# Patient Record
Sex: Female | Born: 1950 | Hispanic: No | Marital: Single | Smoking: Never smoker
Health system: Southern US, Community
[De-identification: ages and names within clinical notes are randomized; demographics above are authoritative.]

## PROBLEM LIST (undated history)

## (undated) DIAGNOSIS — M1990 Unspecified osteoarthritis, unspecified site: Secondary | ICD-10-CM

## (undated) DIAGNOSIS — E119 Type 2 diabetes mellitus without complications: Secondary | ICD-10-CM

## (undated) DIAGNOSIS — Z87442 Personal history of urinary calculi: Secondary | ICD-10-CM

## (undated) DIAGNOSIS — Z8709 Personal history of other diseases of the respiratory system: Secondary | ICD-10-CM

## (undated) DIAGNOSIS — M6530 Trigger finger, unspecified finger: Secondary | ICD-10-CM

## (undated) DIAGNOSIS — E669 Obesity, unspecified: Secondary | ICD-10-CM

## (undated) DIAGNOSIS — E785 Hyperlipidemia, unspecified: Secondary | ICD-10-CM

## (undated) DIAGNOSIS — K259 Gastric ulcer, unspecified as acute or chronic, without hemorrhage or perforation: Secondary | ICD-10-CM

## (undated) DIAGNOSIS — Z972 Presence of dental prosthetic device (complete) (partial): Secondary | ICD-10-CM

## (undated) DIAGNOSIS — I10 Essential (primary) hypertension: Secondary | ICD-10-CM

## (undated) DIAGNOSIS — K219 Gastro-esophageal reflux disease without esophagitis: Secondary | ICD-10-CM

## (undated) DIAGNOSIS — I739 Peripheral vascular disease, unspecified: Secondary | ICD-10-CM

## (undated) HISTORY — DX: Hyperlipidemia, unspecified: E78.5

## (undated) HISTORY — PX: CHOLECYSTECTOMY: SHX55

## (undated) HISTORY — DX: Unspecified osteoarthritis, unspecified site: M19.90

## (undated) HISTORY — PX: LITHOTRIPSY: SUR834

## (undated) HISTORY — PX: CARPAL TUNNEL RELEASE: SHX101

## (undated) HISTORY — DX: Essential (primary) hypertension: I10

## (undated) HISTORY — PX: TRIGGER FINGER RELEASE: SHX641

## (undated) HISTORY — DX: Type 2 diabetes mellitus without complications: E11.9

## (undated) HISTORY — PX: TUBAL LIGATION: SHX77

## (undated) HISTORY — PX: ORIF FINGER FRACTURE: SHX2122

## (undated) HISTORY — PX: KIDNEY STONE SURGERY: SHX686

## (undated) HISTORY — DX: Peripheral vascular disease, unspecified: I73.9

## (undated) HISTORY — PX: TOTAL KNEE ARTHROPLASTY: SHX125

---

## 2003-08-27 ENCOUNTER — Emergency Department (HOSPITAL_COMMUNITY): Admission: EM | Admit: 2003-08-27 | Discharge: 2003-08-27 | Payer: Self-pay | Admitting: Emergency Medicine

## 2011-10-15 ENCOUNTER — Observation Stay (HOSPITAL_COMMUNITY)
Admission: EM | Admit: 2011-10-15 | Discharge: 2011-10-15 | Disposition: A | Discharge status: Home / Self Care | Payer: Medicare Other | Attending: Emergency Medicine | Admitting: Emergency Medicine

## 2011-10-15 ENCOUNTER — Emergency Department (HOSPITAL_COMMUNITY): Payer: Medicare Other

## 2011-10-15 ENCOUNTER — Encounter (HOSPITAL_COMMUNITY): Payer: Self-pay | Admitting: *Deleted

## 2011-10-15 DIAGNOSIS — R062 Wheezing: Principal | ICD-10-CM | POA: Insufficient documentation

## 2011-10-15 HISTORY — DX: Obesity, unspecified: E66.9

## 2011-10-15 LAB — DIFFERENTIAL
Basophils Absolute: 0 10*3/uL (ref 0.0–0.1)
Basophils Relative: 0 % (ref 0–1)
Eosinophils Absolute: 0.1 10*3/uL (ref 0.0–0.7)
Eosinophils Relative: 1 % (ref 0–5)
Lymphs Abs: 1 10*3/uL (ref 0.7–4.0)
Neutrophils Relative %: 87 % — ABNORMAL HIGH (ref 43–77)

## 2011-10-15 LAB — BASIC METABOLIC PANEL
Calcium: 9.4 mg/dL (ref 8.4–10.5)
GFR calc Af Amer: >90 mL/min (ref >90)
GFR calc non Af Amer: >90 mL/min (ref >90)
Glucose, Bld: 147 mg/dL — ABNORMAL HIGH (ref 70–99)
Potassium: 3.4 mEq/L — ABNORMAL LOW (ref 3.5–5.1)
Sodium: 137 mEq/L (ref 135–145)

## 2011-10-15 LAB — CBC
MCH: 27.7 pg (ref 26.0–34.0)
MCV: 86.2 fL (ref 78.0–100.0)
Platelets: 209 10*3/uL (ref 150–400)
RBC: 4.65 MIL/uL (ref 3.87–5.11)
RDW: 13.7 % (ref 11.5–15.5)

## 2011-10-15 MED ORDER — ALBUTEROL SULFATE (5 MG/ML) 0.5% IN NEBU
INHALATION_SOLUTION | RESPIRATORY_TRACT | Status: AC
  Filled 2011-10-15: qty 1

## 2011-10-15 MED ORDER — ALBUTEROL SULFATE HFA 108 (90 BASE) MCG/ACT IN AERS: 1.0000 | INHALATION_SPRAY | Freq: Four times a day (QID) | RESPIRATORY_TRACT | Status: DC | PRN

## 2011-10-15 MED ORDER — PREDNISONE (PAK) 10 MG PO TABS: ORAL_TABLET | ORAL | Status: DC

## 2011-10-15 MED ORDER — ALBUTEROL SULFATE HFA 108 (90 BASE) MCG/ACT IN AERS
2.0000 | INHALATION_SPRAY | RESPIRATORY_TRACT | Status: DC | PRN
  Administered 2011-10-15: 2 via RESPIRATORY_TRACT
  Filled 2011-10-15: qty 6.7

## 2011-10-15 MED ORDER — IPRATROPIUM BROMIDE 0.02 % IN SOLN
RESPIRATORY_TRACT | Status: AC
  Filled 2011-10-15: qty 2.5

## 2011-10-15 MED ORDER — ALBUTEROL SULFATE (5 MG/ML) 0.5% IN NEBU
2.5000 mg | INHALATION_SOLUTION | RESPIRATORY_TRACT | Status: DC | PRN
  Administered 2011-10-15: 2.5 mg via RESPIRATORY_TRACT
  Filled 2011-10-15: qty 0.5

## 2011-10-15 MED ORDER — ALBUTEROL SULFATE (5 MG/ML) 0.5% IN NEBU
2.5000 mg | INHALATION_SOLUTION | Freq: Once | RESPIRATORY_TRACT | Status: AC
  Administered 2011-10-15: 2.5 mg via RESPIRATORY_TRACT

## 2011-10-15 MED ORDER — METHYLPREDNISOLONE SODIUM SUCC 125 MG IJ SOLR
125.0000 mg | Freq: Once | INTRAMUSCULAR | Status: AC
  Administered 2011-10-15: 125 mg via INTRAVENOUS
  Filled 2011-10-15: qty 2

## 2011-10-15 MED ORDER — PREDNISONE 20 MG PO TABS
60.0000 mg | ORAL_TABLET | Freq: Once | ORAL | Status: AC
  Administered 2011-10-15: 60 mg via ORAL
  Filled 2011-10-15: qty 3

## 2011-10-15 MED ORDER — ALBUTEROL SULFATE (5 MG/ML) 0.5% IN NEBU
5.0000 mg | INHALATION_SOLUTION | Freq: Once | RESPIRATORY_TRACT | Status: AC
  Administered 2011-10-15: 5 mg via RESPIRATORY_TRACT

## 2011-10-15 MED ORDER — ALBUTEROL SULFATE (5 MG/ML) 0.5% IN NEBU
INHALATION_SOLUTION | RESPIRATORY_TRACT | Status: AC
  Filled 2011-10-15: qty 0.5

## 2011-10-15 MED ORDER — ALBUTEROL SULFATE (5 MG/ML) 0.5% IN NEBU
5.0000 mg | INHALATION_SOLUTION | Freq: Once | RESPIRATORY_TRACT | Status: AC
  Administered 2011-10-15: 5 mg via RESPIRATORY_TRACT
  Filled 2011-10-15: qty 1

## 2011-10-15 MED ORDER — IPRATROPIUM BROMIDE 0.02 % IN SOLN
0.5000 mg | Freq: Once | RESPIRATORY_TRACT | Status: AC
  Administered 2011-10-15: 0.5 mg via RESPIRATORY_TRACT
  Filled 2011-10-15 (×2): qty 2.5

## 2011-10-15 MED ORDER — ACETAMINOPHEN 325 MG PO TABS
ORAL_TABLET | ORAL | Status: AC
  Administered 2011-10-15: 650 mg
  Filled 2011-10-15: qty 2

## 2011-10-15 MED ORDER — IPRATROPIUM BROMIDE 0.02 % IN SOLN
0.5000 mg | Freq: Once | RESPIRATORY_TRACT | Status: AC
  Administered 2011-10-15: 0.5 mg via RESPIRATORY_TRACT

## 2011-10-15 NOTE — ED Provider Notes (Signed)
Patient placed in CDU for continuation of treatment under adult wheezing protocol.  On initial assessment, patient continues to have expiratory wheezing bilaterally.  Oxygen saturations remaining in mid to upper 90's.  Will repeat albuterol breathing treatment and reassess.  9:42 PM Patient resting comfortably with family at bedside.  Wheezing has subsided, lungs currently CTA bilaterally.  Mildly tachycardic heart rate (likely due to beta agonist).  Patient states she feels better and would like to try to return home. Albuterol HFA provided.  RX for continuation of prednisone and HFA refill provided.  Patient is working on assignment with [facility].  Jimmye Norman, NP 10/15/11 2154

## 2011-10-15 NOTE — ED Notes (Signed)
RESP THERAPY COMING TO DO PEAK FLOW AND TX ON PT

## 2011-10-15 NOTE — Discharge Instructions (Signed)
Using Your Inhaler 1. Take the cap off the mouthpiece.  2. Shake the inhaler for 5 seconds.  3. Turn the inhaler so the bottle is above the mouthpiece. Hold it away from your mouth, at a distance of the width of 2 fingers.  4. Open your mouth widely, and tilt your head back slightly. Let your breath out.  5. Take a deep breath in slowly through your mouth. At the same time, push down on the bottle 1 time. You will feel the medicine enter your mouth and throat as you breathe.  6. Continue to take a deep breath in very slowly.  7. After you have breathed in completely, hold your breath for 10 seconds. This will help the medicine to settle in your lungs. If you cannot hold your breath for 10 seconds, hold it for as long as you can before you breathe out.  8. If your doctor has told you to take more than 1 puff, wait at least 1 minute between puffs. This will help you get the best results from your medicine.  9. If you use a steroid inhaler, rinse out your mouth after each dose.  10. Wash your inhaler once a day. Remove the bottle from the mouthpiece. Rinse the mouthpiece and cap with warm water. Dry everything well before you put the inhaler back together.  Document Released: 02/11/2008 Document Revised: 04/23/2011 Document Reviewed: 02/19/2009 [facility] Patient Information 2012 [place], [place].  Bronchospasm, Adult Bronchospasm means that there is a spasm or tightening of the airways going into the lungs. Because the airways go into a spasm and get smaller it makes breathing more difficult. For reasons not completely known, workings (functions) of the airways designed to protect the lungs become over active. This causes the airways to become more sensitive to:  Infection.   Weather.   Exercise.   Irritants.   Things that cause allergic reactions or allergies (allergens).  Frequent coughing or respiratory episodes should be checked for the cause. This condition may be made worse by  exercise. CAUSES  Inflammation is often the cause of this condition. Allergy, viral respiratory infections, or irritants in the air often cause this problem. Allergic reactions produce immediate and delayed responses. Late reactions may produce more serious inflammation. This may lead to increased reactivity of the airways. Sometimes this is inherited. Some common triggers are:  Allergies.   Infection commonly triggers attacks. Antibiotics are not helpful for viral infections and usually do not help with attacks of bronchospasm.   Exercise (running, etc.) can trigger an attack. Proper pre-exercise medications help most individuals participate in sports. Swimming is the least likely sport to cause problems.   Irritants (for example, pollution, cigarette smoke, strong odors, aerosol sprays, paint fumes, etc.) may trigger attacks. You cannot smoke and do not allow smoking in your home. This is absolutely necessary. Show this instruction to mates, relatives and significant others that may not agree with you.   Weather changes may cause lung problems but moving around trying to find an ideal climate does not seem to be overly helpful. Winds increase molds and pollens in the air. Rain refreshes the air by washing irritants out. Cold air may cause irritation.   Emotional problems do not cause lung problems but can trigger attacks.  SYMPTOMS  Wheezing is the most common symptom. Frequent coughing (with or without exercise and or crying) and repeated respiratory infections are all early warning signs of bronchospasm. Chest tightness and shortness of breath are other symptoms. DIAGNOSIS  Early hidden bronchospasm may go for long periods of time without being detected. This is especially true if wheezing cannot be detected by your caregiver. Lung (pulmonary) function studies may help with diagnosis in these cases. HOME CARE INSTRUCTIONS   It is necessary to remain calm during an attack. Try to relax and  breathe more slowly. During this time medications may be given. If any breathing problems seem to be getting worse and are unresponsive to treatment seek immediate medical care.   If you have severe breathing difficulty or have had a life threatening attack it is probably a good idea for you to learn how to give adrenaline (epi-pen) or use an anaphylaxis kit. Your caregiver can help you with this. These are the same kits carried by people who have severe allergic reactions. This is especially important if you do not have readily accessible medical care.   With any severe breathing problems where epinephrine (adrenaline) has been given at home call 911 immediately as the delayed reaction may be even more severe.  SEEK MEDICAL CARE IF:   There is wheezing and shortness of breath, even if medications are given to prevent attacks.   An oral temperature above 102 F (38.9 C) develops.   There are muscle aches, chest pain, or thickening of sputum.   The sputum changes from clear or white to yellow, green, gray, or bloody.   There are problems that may be related to the medicine you are given, such as a rash, itching, swelling, or trouble breathing.  SEEK IMMEDIATE MEDICAL CARE IF:   The usual medicines do not stop your wheezing, or there is increased coughing.   You have increased difficulty breathing.  MAKE SURE YOU:   Understand these instructions.   Will watch your condition.   Will get help right away if you are not doing well or get worse.  Document Released: 05/07/2003 Document Revised: 04/23/2011 Document Reviewed: 12/21/2007 [facility] Patient Information 2012 [place], [place].

## 2011-10-15 NOTE — ED Notes (Signed)
Reports having cough that started yesterday, now having wheezing, reports hx of asthma, spo2 95% at triage.

## 2011-10-15 NOTE — ED Notes (Signed)
MD at bedside. 

## 2011-10-18 NOTE — ED Provider Notes (Signed)
Medical screening examination/treatment/procedure(s) were performed by non-physician practitioner and as supervising physician I was immediately available for consultation/collaboration.   Gavin Pound. Oletta Lamas, MD 10/18/11 [phone]

## 2011-12-30 NOTE — ED Provider Notes (Signed)
History     CSN: [id number]  Arrival date & time 10/15/11  1206   First MD Initiated Contact with Patient 10/15/11 1330      Chief Complaint  Patient presents with  . Wheezing  . Cough    (Consider location/radiation/quality/duration/timing/severity/associated sxs/prior treatment) Patient is a 61 y.o. female presenting with wheezing and cough. The history is provided by the patient.  Wheezing  Associated symptoms include cough and wheezing.  Cough Associated symptoms include wheezing.  She had onset yesterday of nonproductive cough and wheezing and dyspnea. She denies fever, chills, sweats. She denies chest pain. There's been no nausea or vomiting. Using her albuterol inhaler with no relief. Symptoms are described as severe. Nothing makes her symptoms better but nothing makes it worse either.  Past Medical History  Diagnosis Date  . Asthma   . Acid reflux   . Obesity     History reviewed. No pertinent past surgical history.  History reviewed. No pertinent family history.  History  Substance Use Topics  . Smoking status: Not on file  . Smokeless tobacco: Not on file  . Alcohol Use: No    OB History    Grav Para Term Preterm Abortions TAB SAB Ect Mult Living                  Review of Systems  Respiratory: Positive for cough and wheezing.   All other systems reviewed and are negative.    Allergies  Codeine and Penicillins  Home Medications   Current Outpatient Rx  Name Route Sig Dispense Refill  . VITAMIN D-3 PO Oral Take 1 tablet by mouth daily.    . ALBUTEROL SULFATE HFA 108 (90 BASE) MCG/ACT IN AERS Inhalation Inhale 1-2 puffs into the lungs every 6 (six) hours as needed for wheezing. 1 Inhaler 0  . PREDNISONE (PAK) 10 MG PO TABS  Take 5 tabs day 1 (tomorrow), 4 tabs day 2, 3 tabs day 3, 2 tabs day 4, 1 tab day 5 15 tablet 0    BP 140/75  Pulse 118  Temp 98.5 F (36.9 C) (Oral)  Resp 20  SpO2 99%  Physical Exam  Nursing note and vitals  reviewed. 61year old female, in mild respiratory distress. Vital signs are significant for tachycardia with heart rate 118. Oxygen saturation is 99%, which is normal. Head is normocephalic and atraumatic. PERRLA, EOMI. Oropharynx is clear. Neck is nontender and supple without adenopathy or JVD. Back is nontender and there is no CVA tenderness. Lungs have diffuse inspiratory and expiratory wheezes, no rales or rhonchi. Chest is nontender. Heart has regular rate and rhythm without murmur. Abdomen is soft, flat, nontender without masses or hepatosplenomegaly and peristalsis is normoactive. Extremities have no cyanosis or edema, full range of motion is present. Skin is warm and dry without rash. Neurologic: Mental status is normal, cranial nerves are intact, there are no motor or sensory deficits.   ED Course  Procedures (including critical care time)  Results for orders placed during the hospital encounter of 10/15/11  CBC      Component Value Range   WBC 14.4 (*) 4.0 - 10.5 K/uL   RBC 4.65  3.87 - 5.11 MIL/uL   Hemoglobin 12.9  12.0 - 15.0 g/dL   HCT 40.9  81.1 - 91.4 %   MCV 86.2  78.0 - 100.0 fL   MCH 27.7  26.0 - 34.0 pg   MCHC 32.2  30.0 - 36.0 g/dL   RDW 78.2  95.6 -  15.5 %   Platelets 209  150 - 400 K/uL  DIFFERENTIAL      Component Value Range   Neutrophils Relative 87 (*) 43 - 77 %   Neutro Abs 12.6 (*) 1.7 - 7.7 K/uL   Lymphocytes Relative 7 (*) 12 - 46 %   Lymphs Abs 1.0  0.7 - 4.0 K/uL   Monocytes Relative 5  3 - 12 %   Monocytes Absolute 0.8  0.1 - 1.0 K/uL   Eosinophils Relative 1  0 - 5 %   Eosinophils Absolute 0.1  0.0 - 0.7 K/uL   Basophils Relative 0  0 - 1 %   Basophils Absolute 0.0  0.0 - 0.1 K/uL  BASIC METABOLIC PANEL      Component Value Range   Sodium 137  135 - 145 mEq/L   Potassium 3.4 (*) 3.5 - 5.1 mEq/L   Chloride 98  96 - 112 mEq/L   CO2 24  19 - 32 mEq/L   Glucose, Bld 147 (*) 70 - 99 mg/dL   BUN 12  6 - 23 mg/dL   Creatinine, Ser 1.30  0.50  - 1.10 mg/dL   Calcium 9.4  8.4 - 86.5 mg/dL   GFR calc non Af Amer >90  >90 mL/min   GFR calc Af Amer >90  >90 mL/min    1. Wheezing without diagnosis of asthma       MDM  Acute bronchospasm. She will be given steroids and albuterol with Atrovent..  Inadequate relief with initial nebulizer treatments. She will be placed in CDU under the asthma protocol.        Dione Booze, MD 12/30/11 [phone]

## 2012-03-21 ENCOUNTER — Encounter: Payer: Self-pay | Admitting: Gastroenterology

## 2012-04-13 ENCOUNTER — Ambulatory Visit (INDEPENDENT_AMBULATORY_CARE_PROVIDER_SITE_OTHER): Payer: Medicare Other | Admitting: Gastroenterology

## 2012-04-13 ENCOUNTER — Encounter: Payer: Self-pay | Admitting: Gastroenterology

## 2012-04-13 VITALS — BP 124/82 | HR 72 | Ht <= 58 in | Wt 209.0 lb

## 2012-04-13 DIAGNOSIS — R1310 Dysphagia, unspecified: Secondary | ICD-10-CM | POA: Insufficient documentation

## 2012-04-13 DIAGNOSIS — K222 Esophageal obstruction: Secondary | ICD-10-CM

## 2012-04-13 DIAGNOSIS — Z1211 Encounter for screening for malignant neoplasm of colon: Secondary | ICD-10-CM

## 2012-04-13 DIAGNOSIS — K219 Gastro-esophageal reflux disease without esophagitis: Secondary | ICD-10-CM

## 2012-04-13 MED ORDER — OMEPRAZOLE-SODIUM BICARBONATE 40-1100 MG PO CAPS: ORAL_CAPSULE | ORAL | Status: DC

## 2012-04-13 MED ORDER — NA SULFATE-K SULFATE-MG SULF 17.5-3.13-1.6 GM/177ML PO SOLN: 1.0000 | Freq: Once | ORAL | Status: DC

## 2012-04-13 NOTE — Assessment & Plan Note (Signed)
The patient is symptomatic with pyrosis. Cough may also be related.  Recommendations #1 trial of Zegerid before dinner

## 2012-04-13 NOTE — Progress Notes (Signed)
History of Present Illness: Pleasant 61 year old Hispanic female referred at the request of Dr. Daphine Deutscher for evaluation of dysphagia. She has developed dysphagia to solids. She's had what sounds like a minor food impactions. She has frequent pyrosis for which she has taken PPIs in the past. She's also complaining of nocturnal coughing.    Past Medical History  Diagnosis Date  . Asthma   . Acid reflux   . Obesity   . Arthritis    Past Surgical History  Procedure Date  . Cholecystectomy   . Tubal ligation   . Finger surgery   . Carpal tunnel release   . Trigger finger release   . Total knee arthroplasty     Bilateral   . Leg surgery    family history is not on file.  She is adopted. Current Outpatient Prescriptions  Medication Sig Dispense Refill  . albuterol (PROVENTIL HFA;VENTOLIN HFA) 108 (90 BASE) MCG/ACT inhaler Inhale 1-2 puffs into the lungs every 6 (six) hours as needed for wheezing.  1 Inhaler  0  . Cholecalciferol (VITAMIN D-3 PO) Take 1 tablet by mouth daily.      . predniSONE (STERAPRED UNI-PAK) 10 MG tablet Take 5 tabs day 1 (tomorrow), 4 tabs day 2, 3 tabs day 3, 2 tabs day 4, 1 tab day 5  15 tablet  0   Allergies as of 04/13/2012 - Review Complete 04/13/2012  Allergen Reaction Noted  . Codeine Nausea And Vomiting 10/15/2011  . Penicillins Other (See Comments) 10/15/2011    reports that she has never smoked. She has never used smokeless tobacco. She reports that she does not drink alcohol or use illicit drugs.     Review of Systems: She's had a vaginal sling in the past and recently has been complaining of dropping of her organs. Pertinent positive and negative review of systems were noted in the above HPI section. All other review of systems were otherwise negative.  Vital signs were reviewed in today's medical record Physical Exam: General: Well developed , well nourished, no acute distress Head: Normocephalic and atraumatic Eyes:  sclerae anicteric,  EOMI Ears: Normal auditory acuity Mouth: No deformity or lesions Neck: Supple, no masses or thyromegaly Lungs: Clear throughout to auscultation Heart: Regular rate and rhythm; no murmurs, rubs or bruits Abdomen: Soft, non tender and non distended. No masses, hepatosplenomegaly or hernias noted. Normal Bowel sounds Rectal:deferred Musculoskeletal: Symmetrical with no gross deformities  Skin: No lesions on visible extremities Pulses:  Normal pulses noted Extremities: No clubbing, cyanosis, edema or deformities noted Neurological: Alert oriented x 4, grossly nonfocal Cervical Nodes:  No significant cervical adenopathy Inguinal Nodes: No significant inguinal adenopathy Psychological:  Alert and cooperative. Normal mood and affect

## 2012-04-13 NOTE — Assessment & Plan Note (Signed)
Suspect peptic stricture  Recommendations #1 upper endoscopy with dilatation as indicated

## 2012-04-13 NOTE — Assessment & Plan Note (Signed)
Plan screening colonoscopy 

## 2012-04-13 NOTE — Patient Instructions (Addendum)
Your Endoscopy has been scheduled on 05/27/2012 at 4pm Your Colonoscopy has been scheduled on 06/09/2012 at 2pm Separate instructions have been given Bring all inhalers with you to your appointment

## 2012-05-27 ENCOUNTER — Encounter: Payer: Self-pay | Admitting: Gastroenterology

## 2012-05-27 ENCOUNTER — Ambulatory Visit (AMBULATORY_SURGERY_CENTER): Payer: Medicare Other | Admitting: Gastroenterology

## 2012-05-27 VITALS — BP 109/72 | HR 100 | Temp 97.3°F | Resp 17 | Ht <= 58 in | Wt 209.0 lb

## 2012-05-27 DIAGNOSIS — R1310 Dysphagia, unspecified: Secondary | ICD-10-CM

## 2012-05-27 DIAGNOSIS — K222 Esophageal obstruction: Secondary | ICD-10-CM

## 2012-05-27 DIAGNOSIS — K219 Gastro-esophageal reflux disease without esophagitis: Secondary | ICD-10-CM

## 2012-05-27 HISTORY — PX: ESOPHAGOGASTRODUODENOSCOPY (EGD) WITH ESOPHAGEAL DILATION: SHX5812

## 2012-05-27 MED ORDER — SODIUM CHLORIDE 0.9 % IV SOLN: 500.0000 mL | INTRAVENOUS | Status: DC

## 2012-05-27 NOTE — Op Note (Signed)
 [facility] [address] [place], [address]   ENDOSCOPY PROCEDURE REPORT  PATIENT: Lindsay Fischer, Lindsay Fischer  MR#: [id number] BIRTHDATE: September 12, 1950 , 61  yrs. old GENDER: Female ENDOSCOPIST: Louis Meckel, MD REFERRED BY:  Alwyn Pea, M.D. PROCEDURE DATE:  05/27/2012 PROCEDURE:  EGD, diagnostic and Maloney dilation of esophagus ASA CLASS:     Class II INDICATIONS:  Dysphagia. MEDICATIONS: MAC sedation, administered by CRNA, Propofol (Diprivan) 80 mg IV, and Robinul 0.2 mg IV TOPICAL ANESTHETIC:  DESCRIPTION OF PROCEDURE: After the risks benefits and alternatives of the procedure were thoroughly explained, informed consent was obtained.  The [facility] [id number] [id number] endoscope was introduced through the mouth and advanced to the third portion of the duodenum. Without limitations.  The instrument was slowly withdrawn as the mucosa was fully examined.      At the GE junction there was an early esophageal stricture.  The 9.8 mm gastroscope passed with mild resistance.  There was no heme.  A small sliding hiatal hernia was present.   At the GE junction there was an early esophageal stricture.  The 9.8 mm gastroscope passed with mild resistance.  There was no heme.  A small sliding hiatal hernia was present.   The remainder of the upper endoscopy exam was otherwise normal.  Retroflexed views revealed no abnormalities. The scope was then withdrawn from the patient and the procedure completed.  COMPLICATIONS: There were no complications. ENDOSCOPIC IMPRESSION: 1.   early esophageal stricture-status post [facility] dilation  RECOMMENDATIONS: repeat dilatation as needed REPEAT EXAM:  eSigned:  Louis Meckel, MD 05/27/2012 4:38 PM   CC:

## 2012-05-27 NOTE — Progress Notes (Signed)
Patient did not experience any of the following events: a burn prior to discharge; a fall within the facility; wrong site/side/patient/procedure/implant event; or a hospital transfer or hospital admission upon discharge from the facility. (G8907) Patient did not have preoperative order for IV antibiotic SSI prophylaxis. (G8918)  

## 2012-05-27 NOTE — Progress Notes (Signed)
Stable to RR 

## 2012-05-27 NOTE — Patient Instructions (Addendum)
Discharge instructions given with verbal understanding. Handout on a dilatation diet given. Resume previous medications. YOU HAD AN ENDOSCOPIC PROCEDURE TODAY AT THE [facility]: Refer to the procedure report that was given to you for any specific questions about what was found during the examination.  If the procedure report does not answer your questions, please call your gastroenterologist to clarify.  If you requested that your care partner not be given the details of your procedure findings, then the procedure report has been included in a sealed envelope for you to review at your convenience later.  YOU SHOULD EXPECT: Some feelings of bloating in the abdomen. Passage of more gas than usual.  Walking can help get rid of the air that was put into your GI tract during the procedure and reduce the bloating. If you had a lower endoscopy (such as a colonoscopy or flexible sigmoidoscopy) you may notice spotting of blood in your stool or on the toilet paper. If you underwent a bowel prep for your procedure, then you may not have a normal bowel movement for a few days.  DIET: Your first meal following the procedure should be a light meal and then it is ok to progress to your normal diet.  A half-sandwich or bowl of soup is an example of a good first meal.  Heavy or fried foods are harder to digest and may make you feel nauseous or bloated.  Likewise meals heavy in dairy and vegetables can cause extra gas to form and this can also increase the bloating.  Drink plenty of fluids but you should avoid alcoholic beverages for 24 hours.  ACTIVITY: Your care partner should take you home directly after the procedure.  You should plan to take it easy, moving slowly for the rest of the day.  You can resume normal activity the day after the procedure however you should NOT DRIVE or use heavy machinery for 24 hours (because of the sedation medicines used during the test).    SYMPTOMS TO REPORT  IMMEDIATELY: A gastroenterologist can be reached at any hour.  During normal business hours, 8:30 AM to 5:00 PM Monday through Friday, call [phone].  After hours and on weekends, please call the GI answering service at [phone] who will take a message and have the physician on call contact you.   Following upper endoscopy (EGD)  Vomiting of blood or coffee ground material  New chest pain or pain under the shoulder blades  Painful or persistently difficult swallowing  New shortness of breath  Fever of 100F or higher  Black, tarry-looking stools  FOLLOW UP: If any biopsies were taken you will be contacted by phone or by letter within the next 1-3 weeks.  Call your gastroenterologist if you have not heard about the biopsies in 3 weeks.  Our staff will call the home number listed on your records the next business day following your procedure to check on you and address any questions or concerns that you may have at that time regarding the information given to you following your procedure. This is a courtesy call and so if there is no answer at the home number and we have not heard from you through the emergency physician on call, we will assume that you have returned to your regular daily activities without incident.  SIGNATURES/CONFIDENTIALITY: You and/or your care partner have signed paperwork which will be entered into your electronic medical record.  These signatures attest to the fact that that the information   above on your After Visit Summary has been reviewed and is understood.  Full responsibility of the confidentiality of this discharge information lies with you and/or your care-partner. 

## 2012-05-30 ENCOUNTER — Telehealth: Payer: Self-pay | Admitting: *Deleted

## 2012-05-30 NOTE — Telephone Encounter (Signed)
  Follow up Call-  Call back number 05/27/2012  Post procedure Call Back phone  # [phone]  Permission to leave phone message Yes     Patient questions:  Do you have a fever, pain , or abdominal swelling? no Pain Score  0 *  Have you tolerated food without any problems? yes  Have you been able to return to your normal activities? yes  Do you have any questions about your discharge instructions: Diet   no Medications  no Follow up visit  no  Do you have questions or concerns about your Care? no  Actions: * If pain score is 4 or above: No action needed, pain <4.

## 2012-06-09 ENCOUNTER — Encounter: Payer: Self-pay | Admitting: Gastroenterology

## 2012-06-09 ENCOUNTER — Ambulatory Visit (AMBULATORY_SURGERY_CENTER): Payer: Medicare Other | Admitting: Gastroenterology

## 2012-06-09 VITALS — BP 91/49 | HR 88 | Temp 98.6°F | Resp 12 | Ht <= 58 in | Wt 209.0 lb

## 2012-06-09 DIAGNOSIS — Z1211 Encounter for screening for malignant neoplasm of colon: Secondary | ICD-10-CM

## 2012-06-09 HISTORY — PX: COLONOSCOPY WITH PROPOFOL: SHX5780

## 2012-06-09 MED ORDER — SODIUM CHLORIDE 0.9 % IV SOLN: 500.0000 mL | INTRAVENOUS | Status: DC

## 2012-06-09 NOTE — Progress Notes (Signed)
VSS, A&O x3 pleased with MAC. Report to [employer]. DRM

## 2012-06-09 NOTE — Progress Notes (Signed)
Patient did not have preoperative order for IV antibiotic SSI prophylaxis. (G8918)   

## 2012-06-09 NOTE — Patient Instructions (Addendum)
YOU HAD AN ENDOSCOPIC PROCEDURE TODAY AT THE [facility]: Refer to the procedure report that was given to you for any specific questions about what was found during the examination.  If the procedure report does not answer your questions, please call your gastroenterologist to clarify.  If you requested that your care partner not be given the details of your procedure findings, then the procedure report has been included in a sealed envelope for you to review at your convenience later.  YOU SHOULD EXPECT: Some feelings of bloating in the abdomen. Passage of more gas than usual.  Walking can help get rid of the air that was put into your GI tract during the procedure and reduce the bloating. If you had a lower endoscopy (such as a colonoscopy or flexible sigmoidoscopy) you may notice spotting of blood in your stool or on the toilet paper. If you underwent a bowel prep for your procedure, then you may not have a normal bowel movement for a few days.  DIET: Your first meal following the procedure should be a light meal and then it is ok to progress to your normal diet.  A half-sandwich or bowl of soup is an example of a good first meal.  Heavy or fried foods are harder to digest and may make you feel nauseous or bloated.  Likewise meals heavy in dairy and vegetables can cause extra gas to form and this can also increase the bloating.  Drink plenty of fluids but you should avoid alcoholic beverages for 24 hours.  ACTIVITY: Your care partner should take you home directly after the procedure.  You should plan to take it easy, moving slowly for the rest of the day.  You can resume normal activity the day after the procedure however you should NOT DRIVE or use heavy machinery for 24 hours (because of the sedation medicines used during the test).    SYMPTOMS TO REPORT IMMEDIATELY: A gastroenterologist can be reached at any hour.  During normal business hours, 8:30 AM to 5:00 PM Monday through Friday,  call [phone].  After hours and on weekends, please call the GI answering service at [phone] who will take a message and have the physician on call contact you.   Following lower endoscopy (colonoscopy or flexible sigmoidoscopy):  Excessive amounts of blood in the stool  Significant tenderness or worsening of abdominal pains  Swelling of the abdomen that is new, acute  Fever of 100F or higher  FOLLOW UP: If any biopsies were taken you will be contacted by phone or by letter within the next 1-3 weeks.  Call your gastroenterologist if you have not heard about the biopsies in 3 weeks.  Our staff will call the home number listed on your records the next business day following your procedure to check on you and address any questions or concerns that you may have at that time regarding the information given to you following your procedure. This is a courtesy call and so if there is no answer at the home number and we have not heard from you through the emergency physician on call, we will assume that you have returned to your regular daily activities without incident.  SIGNATURES/CONFIDENTIALITY: You and/or your care partner have signed paperwork which will be entered into your electronic medical record.  These signatures attest to the fact that that the information above on your After Visit Summary has been reviewed and is understood.  Full responsibility of the confidentiality of this   discharge information lies with you and/or your care-partner.   Thank-you for choosing us for your healthcare needs. 

## 2012-06-09 NOTE — Op Note (Signed)
[facility] [address] [place], [address]   COLONOSCOPY PROCEDURE REPORT  PATIENT: Lindsay Fischer, Lindsay Fischer  MR#: [id number] BIRTHDATE: 1951-03-09 , 61  yrs. old GENDER: Female ENDOSCOPIST: Louis Meckel, MD REFERRED DG:UYQIH Martin, M.D. PROCEDURE DATE:  06/09/2012 PROCEDURE:   Colonoscopy, diagnostic ASA CLASS:   Class II INDICATIONS:average risk screening. MEDICATIONS: MAC sedation, administered by CRNA and propofol (Diprivan) 250mg  IV  DESCRIPTION OF PROCEDURE:   After the risks benefits and alternatives of the procedure were thoroughly explained, informed consent was obtained.  A digital rectal exam revealed no abnormalities of the rectum.   The     endoscope was introduced through the anus and advanced to the cecum, which was identified by both the appendix and ileocecal valve. No adverse events experienced.   The quality of the prep was Suprep excellent  The instrument was then slowly withdrawn as the colon was fully examined.      COLON FINDINGS: A normal appearing cecum, ileocecal valve, and appendiceal orifice were identified.  The ascending, hepatic flexure, transverse, splenic flexure, descending, sigmoid colon and rectum appeared unremarkable.  No polyps or cancers were seen. Retroflexed views revealed no abnormalities. The time to cecum=4 minutes 43 seconds.  Withdrawal time=8 minutes 18 seconds.  The scope was withdrawn and the procedure completed. COMPLICATIONS: There were no complications.  ENDOSCOPIC IMPRESSION: Normal colon  RECOMMENDATIONS: Continue current colorectal screening recommendations for "routine risk" patients with a repeat colonoscopy in 10 years.   eSigned:  Louis Meckel, MD 06/09/2012 2:51 PM   cc:

## 2012-06-10 ENCOUNTER — Telehealth: Payer: Self-pay | Admitting: *Deleted

## 2012-06-10 NOTE — Telephone Encounter (Signed)
  Follow up Call-  Call back number 06/09/2012 05/27/2012  Post procedure Call Back phone  # [phone]  Permission to leave phone message No Yes     Patient questions:  Do you have a fever, pain , or abdominal swelling? no Pain Score  0 *  Have you tolerated food without any problems? yes  Have you been able to return to your normal activities? yes  Do you have any questions about your discharge instructions: Diet   no Medications  no Follow up visit  no  Do you have questions or concerns about your Care? no  Actions: * If pain score is 4 or above: No action needed, pain <4.

## 2012-11-18 ENCOUNTER — Encounter (HOSPITAL_COMMUNITY): Payer: Self-pay | Admitting: *Deleted

## 2012-11-18 ENCOUNTER — Emergency Department (HOSPITAL_COMMUNITY)
Admission: EM | Admit: 2012-11-18 | Discharge: 2012-11-19 | Disposition: A | Discharge status: Home / Self Care | Payer: Medicare Other | Attending: Emergency Medicine | Admitting: Emergency Medicine

## 2012-11-18 DIAGNOSIS — R059 Cough, unspecified: Secondary | ICD-10-CM | POA: Insufficient documentation

## 2012-11-18 DIAGNOSIS — J069 Acute upper respiratory infection, unspecified: Secondary | ICD-10-CM | POA: Insufficient documentation

## 2012-11-18 DIAGNOSIS — Z79899 Other long term (current) drug therapy: Secondary | ICD-10-CM | POA: Insufficient documentation

## 2012-11-18 DIAGNOSIS — J3489 Other specified disorders of nose and nasal sinuses: Secondary | ICD-10-CM | POA: Insufficient documentation

## 2012-11-18 DIAGNOSIS — R05 Cough: Secondary | ICD-10-CM | POA: Insufficient documentation

## 2012-11-18 DIAGNOSIS — E669 Obesity, unspecified: Secondary | ICD-10-CM | POA: Insufficient documentation

## 2012-11-18 DIAGNOSIS — J45901 Unspecified asthma with (acute) exacerbation: Secondary | ICD-10-CM | POA: Insufficient documentation

## 2012-11-18 DIAGNOSIS — Z8739 Personal history of other diseases of the musculoskeletal system and connective tissue: Secondary | ICD-10-CM | POA: Insufficient documentation

## 2012-11-18 DIAGNOSIS — Z8719 Personal history of other diseases of the digestive system: Secondary | ICD-10-CM | POA: Insufficient documentation

## 2012-11-18 DIAGNOSIS — Z880 Allergy status to penicillin: Secondary | ICD-10-CM | POA: Insufficient documentation

## 2012-11-18 NOTE — ED Notes (Signed)
The pts throat has been itching for 3 weeks.  She woke up this am with a headache and sinus congestion cough .  She has felt warm unknown if she had a temp

## 2012-11-19 ENCOUNTER — Emergency Department (HOSPITAL_COMMUNITY): Payer: Medicare Other

## 2012-11-19 MED ORDER — ALBUTEROL SULFATE HFA 108 (90 BASE) MCG/ACT IN AERS
2.0000 | INHALATION_SPRAY | Freq: Once | RESPIRATORY_TRACT | Status: AC
  Administered 2012-11-19: 2 via RESPIRATORY_TRACT
  Filled 2012-11-19: qty 6.7

## 2012-11-19 MED ORDER — PREDNISONE 20 MG PO TABS: 40.0000 mg | ORAL_TABLET | Freq: Every day | ORAL | Status: DC

## 2012-11-19 MED ORDER — ALBUTEROL SULFATE (5 MG/ML) 0.5% IN NEBU
2.5000 mg | INHALATION_SOLUTION | Freq: Once | RESPIRATORY_TRACT | Status: AC
  Administered 2012-11-19: 2.5 mg via RESPIRATORY_TRACT
  Filled 2012-11-19: qty 0.5

## 2012-11-19 MED ORDER — IPRATROPIUM BROMIDE 0.02 % IN SOLN
0.5000 mg | Freq: Once | RESPIRATORY_TRACT | Status: AC
  Administered 2012-11-19: 0.5 mg via RESPIRATORY_TRACT
  Filled 2012-11-19: qty 2.5

## 2012-11-19 MED ORDER — IBUPROFEN 200 MG PO TABS
600.0000 mg | ORAL_TABLET | Freq: Once | ORAL | Status: AC
  Administered 2012-11-19: 600 mg via ORAL
  Filled 2012-11-19: qty 3

## 2012-11-19 MED ORDER — PREDNISONE 20 MG PO TABS
60.0000 mg | ORAL_TABLET | Freq: Once | ORAL | Status: AC
  Administered 2012-11-19: 60 mg via ORAL
  Filled 2012-11-19: qty 3

## 2012-11-19 NOTE — ED Notes (Signed)
meds given  To xray

## 2012-11-19 NOTE — ED Provider Notes (Signed)
History    CSN: [id number] Arrival date & time 11/18/12  2225  First MD Initiated Contact with Patient 11/18/12 2356     Chief Complaint  Patient presents with  . Glenford Peers    (Consider location/radiation/quality/duration/timing/severity/associated sxs/prior Treatment) HPI Comments: Patient is a 62 year old female with a history of asthma who presents for upper respiratory symptoms times one day. Patient states she woke up this morning with nasal congestion, rhinorrhea, productive cough, and wheezing. She states that symptoms were preceded by a sensation that her throat was itching for 3 weeks. Patient denies aggravating factors of her symptoms and states that she has been taking some "cold and flu medicine" without relief. Patient denies fevers, eye redness, ear discharge, sore throat, difficulty swallowing, CP, SOB, N/V, abdominal pain, numbness/tingling, and extremity weakness.  The history is provided by the patient. No language interpreter was used.   Past Medical History  Diagnosis Date  . Asthma   . Acid reflux   . Obesity   . Arthritis    Past Surgical History  Procedure Laterality Date  . Cholecystectomy    . Tubal ligation    . Finger surgery    . Carpal tunnel release    . Trigger finger release    . Total knee arthroplasty      Bilateral   . Leg surgery     Family History  Problem Relation Age of Onset  . Adopted: Yes   History  Substance Use Topics  . Smoking status: Never Smoker   . Smokeless tobacco: Never Used  . Alcohol Use: No   OB History   Grav Para Term Preterm Abortions TAB SAB Ect Mult Living                 Review of Systems  Constitutional: Negative for fever.  HENT: Positive for congestion, rhinorrhea and sinus pressure. Negative for sore throat, drooling, trouble swallowing, neck pain, neck stiffness and ear discharge.   Respiratory: Positive for cough. Negative for shortness of breath.   Cardiovascular: Negative for chest pain.   Gastrointestinal: Negative for nausea and vomiting.  All other systems reviewed and are negative.   Allergies  Codeine and Penicillins  Home Medications   Current Outpatient Rx  Name  Route  Sig  Dispense  Refill  . oxybutynin (DITROPAN-XL) 5 MG 24 hr tablet   Oral   Take 5 mg by mouth daily.         . predniSONE (DELTASONE) 20 MG tablet   Oral   Take 2 tablets (40 mg total) by mouth daily.   10 tablet   0    BP 144/81  Pulse 123  Temp(Src) 98.4 F (36.9 C) (Oral)  Resp 18  SpO2 98%  Physical Exam  Nursing note and vitals reviewed. Constitutional: She is oriented to person, place, and time. She appears well-developed and well-nourished. No distress.  HENT:  Head: Normocephalic and atraumatic.  Right Ear: Tympanic membrane, external ear and ear canal normal.  Left Ear: External ear and ear canal normal. A middle ear effusion is present.  Nose: Nose normal.  Mouth/Throat: Uvula is midline and mucous membranes are normal. No edematous. Posterior oropharyngeal erythema present. No oropharyngeal exudate, posterior oropharyngeal edema or tonsillar abscesses.  Neck: Normal range of motion. Neck supple.  Cardiovascular:  Tachycardic rate, regular rhythm. Heart sounds normal  Pulmonary/Chest: Effort normal. No respiratory distress. She has wheezes (Diffuse expiratory wheezes). She has no rales. She exhibits no tenderness.  Abdominal: Soft. There  is no tenderness. There is no rebound and no guarding.  Musculoskeletal: Normal range of motion.  Lymphadenopathy:    She has no cervical adenopathy.  Neurological: She is alert and oriented to person, place, and time.  Skin: Skin is warm and dry. No rash noted. She is not diaphoretic. No erythema. No pallor.  Psychiatric: She has a normal mood and affect. Her behavior is normal.   ED Course  Procedures (including critical care time) Labs Reviewed - No data to display Dg Chest 2 View  11/19/2012   *RADIOLOGY REPORT*  Clinical  Data: Throat inching for 3 weeks.  Patient woke up this morning with headache and sinus congestion.  Cough.  CHEST - 2 VIEW  Comparison: 10/15/2011  Findings: The heart size and pulmonary vascularity are normal. The lungs appear clear and expanded without focal air space disease or consolidation. No blunting of the costophrenic angles.  No pneumothorax.  Mediastinal contours appear intact.  Degenerative changes in the spine and shoulders.  No significant change since previous study.  IMPRESSION: No evidence of active pulmonary disease.   Original Report Authenticated By: Burman Nieves, M.D.    1. Viral URI with cough     MDM  Patient presents for nasal congestion, rhinorrhea, and congested, productive cough x 1 day. Patient with hx of asthma and tx in ED with PO prednisone and albuterol and atrovent nebulizer tx with significant relief of wheezing. CXR negative for pneumonia, PTX, pleural effusion, or other acute cardiopulmonary abnormality by my interpretation. Patient ambulates without hypoxia in ED; saturations remained at 98%. Patient hemodynamically stable and appropriate for discharge with primary care followup for further evaluation of symptoms. Patient given prescription for prednisone as well as albuterol inhaler to use as needed for shortness of breath. Mucinex D advised for cough and congestion. Indications for ED return discussed with the patient verbalizes comfort and understanding with this discharge plan.    Antony Madura, PA-C 11/20/12 2101

## 2012-11-19 NOTE — ED Notes (Signed)
The pt is ok

## 2012-11-19 NOTE — ED Notes (Signed)
Pt continues to cough.  Rt at the bedside giving a hhn

## 2012-11-20 NOTE — ED Provider Notes (Signed)
Medical screening examination/treatment/procedure(s) were performed by non-physician practitioner and as supervising physician I was immediately available for consultation/collaboration.    Vida Roller, MD 11/20/12 2237

## 2015-11-22 ENCOUNTER — Other Ambulatory Visit: Payer: Self-pay | Admitting: Internal Medicine

## 2015-11-22 DIAGNOSIS — Z1231 Encounter for screening mammogram for malignant neoplasm of breast: Secondary | ICD-10-CM

## 2015-12-04 ENCOUNTER — Ambulatory Visit
Admission: RE | Admit: 2015-12-04 | Discharge: 2015-12-04 | Disposition: A | Discharge status: Home / Self Care | Payer: Medicare Other | Source: Ambulatory Visit | Attending: Internal Medicine | Admitting: Internal Medicine

## 2015-12-04 DIAGNOSIS — Z1231 Encounter for screening mammogram for malignant neoplasm of breast: Secondary | ICD-10-CM

## 2016-03-27 HISTORY — PX: LITHOTRIPSY: SUR834

## 2016-05-04 ENCOUNTER — Other Ambulatory Visit: Payer: Self-pay | Admitting: Orthopedic Surgery

## 2016-05-18 DIAGNOSIS — M6530 Trigger finger, unspecified finger: Secondary | ICD-10-CM

## 2016-05-18 HISTORY — DX: Trigger finger, unspecified finger: M65.30

## 2016-06-09 ENCOUNTER — Encounter (HOSPITAL_BASED_OUTPATIENT_CLINIC_OR_DEPARTMENT_OTHER): Payer: Self-pay | Admitting: *Deleted

## 2016-06-16 ENCOUNTER — Encounter (HOSPITAL_BASED_OUTPATIENT_CLINIC_OR_DEPARTMENT_OTHER)
Admission: RE | Disposition: A | Discharge status: Home / Self Care | Payer: Self-pay | Source: Ambulatory Visit | Attending: Orthopedic Surgery

## 2016-06-16 ENCOUNTER — Ambulatory Visit (HOSPITAL_BASED_OUTPATIENT_CLINIC_OR_DEPARTMENT_OTHER)
Admission: RE | Admit: 2016-06-16 | Discharge: 2016-06-16 | Disposition: A | Discharge status: Home / Self Care | Payer: Medicare Other | Source: Ambulatory Visit | Attending: Orthopedic Surgery | Admitting: Orthopedic Surgery

## 2016-06-16 ENCOUNTER — Ambulatory Visit (HOSPITAL_BASED_OUTPATIENT_CLINIC_OR_DEPARTMENT_OTHER): Payer: Medicare Other | Admitting: Anesthesiology

## 2016-06-16 ENCOUNTER — Encounter (HOSPITAL_BASED_OUTPATIENT_CLINIC_OR_DEPARTMENT_OTHER): Payer: Self-pay | Admitting: Anesthesiology

## 2016-06-16 DIAGNOSIS — M65351 Trigger finger, right little finger: Secondary | ICD-10-CM | POA: Insufficient documentation

## 2016-06-16 DIAGNOSIS — Z6841 Body Mass Index (BMI) 40.0 and over, adult: Secondary | ICD-10-CM | POA: Diagnosis not present

## 2016-06-16 DIAGNOSIS — E669 Obesity, unspecified: Secondary | ICD-10-CM | POA: Insufficient documentation

## 2016-06-16 DIAGNOSIS — M65341 Trigger finger, right ring finger: Secondary | ICD-10-CM | POA: Insufficient documentation

## 2016-06-16 DIAGNOSIS — Z96653 Presence of artificial knee joint, bilateral: Secondary | ICD-10-CM | POA: Insufficient documentation

## 2016-06-16 DIAGNOSIS — Z79899 Other long term (current) drug therapy: Secondary | ICD-10-CM | POA: Insufficient documentation

## 2016-06-16 DIAGNOSIS — Z7952 Long term (current) use of systemic steroids: Secondary | ICD-10-CM | POA: Insufficient documentation

## 2016-06-16 HISTORY — DX: Personal history of urinary calculi: Z87.442

## 2016-06-16 HISTORY — DX: Trigger finger, unspecified finger: M65.30

## 2016-06-16 HISTORY — DX: Personal history of other diseases of the respiratory system: Z87.09

## 2016-06-16 HISTORY — DX: Presence of dental prosthetic device (complete) (partial): Z97.2

## 2016-06-16 HISTORY — DX: Gastric ulcer, unspecified as acute or chronic, without hemorrhage or perforation: K25.9

## 2016-06-16 HISTORY — DX: Unspecified osteoarthritis, unspecified site: M19.90

## 2016-06-16 HISTORY — PX: TRIGGER FINGER RELEASE: SHX641

## 2016-06-16 HISTORY — DX: Gastro-esophageal reflux disease without esophagitis: K21.9

## 2016-06-16 SURGERY — RELEASE, A1 PULLEY, FOR TRIGGER FINGER
Anesthesia: Monitor Anesthesia Care | Site: Finger | Laterality: Right

## 2016-06-16 MED ORDER — FENTANYL CITRATE (PF) 100 MCG/2ML IJ SOLN
INTRAMUSCULAR | Status: AC
  Filled 2016-06-16: qty 2

## 2016-06-16 MED ORDER — SCOPOLAMINE 1 MG/3DAYS TD PT72: 1.0000 | MEDICATED_PATCH | Freq: Once | TRANSDERMAL | Status: DC | PRN

## 2016-06-16 MED ORDER — FENTANYL CITRATE (PF) 100 MCG/2ML IJ SOLN: 25.0000 ug | INTRAMUSCULAR | Status: DC | PRN

## 2016-06-16 MED ORDER — MIDAZOLAM HCL 2 MG/2ML IJ SOLN
INTRAMUSCULAR | Status: AC
Start: 2016-06-16 — End: 2016-06-16
  Filled 2016-06-16: qty 2

## 2016-06-16 MED ORDER — VANCOMYCIN HCL IN DEXTROSE 1-5 GM/200ML-% IV SOLN
INTRAVENOUS | Status: AC
  Filled 2016-06-16: qty 200

## 2016-06-16 MED ORDER — PROPOFOL 10 MG/ML IV BOLUS
INTRAVENOUS | Status: DC | PRN
  Administered 2016-06-16 (×2): 30 mg via INTRAVENOUS

## 2016-06-16 MED ORDER — MIDAZOLAM HCL 2 MG/2ML IJ SOLN
1.0000 mg | INTRAMUSCULAR | Status: DC | PRN
Start: 2016-06-16 — End: 2016-06-16
  Administered 2016-06-16: 2 mg via INTRAVENOUS

## 2016-06-16 MED ORDER — MEPERIDINE HCL 25 MG/ML IJ SOLN: 6.2500 mg | INTRAMUSCULAR | Status: DC | PRN

## 2016-06-16 MED ORDER — FENTANYL CITRATE (PF) 100 MCG/2ML IJ SOLN
50.0000 ug | INTRAMUSCULAR | Status: DC | PRN
  Administered 2016-06-16: 100 ug via INTRAVENOUS

## 2016-06-16 MED ORDER — BUPIVACAINE HCL (PF) 0.25 % IJ SOLN
INTRAMUSCULAR | Status: AC
  Filled 2016-06-16: qty 150

## 2016-06-16 MED ORDER — OXYCODONE HCL 5 MG/5ML PO SOLN: 5.0000 mg | Freq: Once | ORAL | Status: DC | PRN

## 2016-06-16 MED ORDER — CHLORHEXIDINE GLUCONATE 4 % EX LIQD: 60.0000 mL | Freq: Once | CUTANEOUS | Status: DC

## 2016-06-16 MED ORDER — PROMETHAZINE HCL 25 MG/ML IJ SOLN: 6.2500 mg | INTRAMUSCULAR | Status: DC | PRN

## 2016-06-16 MED ORDER — OXYCODONE HCL 5 MG PO TABS: 5.0000 mg | ORAL_TABLET | Freq: Once | ORAL | Status: DC | PRN

## 2016-06-16 MED ORDER — VANCOMYCIN HCL IN DEXTROSE 1-5 GM/200ML-% IV SOLN
1000.0000 mg | INTRAVENOUS | Status: AC
  Administered 2016-06-16: 1000 mg via INTRAVENOUS

## 2016-06-16 MED ORDER — ONDANSETRON HCL 4 MG/2ML IJ SOLN
INTRAMUSCULAR | Status: DC | PRN
Start: 2016-06-16 — End: 2016-06-16
  Administered 2016-06-16: 4 mg via INTRAVENOUS

## 2016-06-16 MED ORDER — LACTATED RINGERS IV SOLN
INTRAVENOUS | Status: DC
  Administered 2016-06-16: 08:00:00 via INTRAVENOUS

## 2016-06-16 MED ORDER — CEFAZOLIN SODIUM-DEXTROSE 2-4 GM/100ML-% IV SOLN
INTRAVENOUS | Status: AC
  Filled 2016-06-16: qty 100

## 2016-06-16 MED ORDER — TRAMADOL HCL 50 MG PO TABS: 50.0000 mg | ORAL_TABLET | Freq: Four times a day (QID) | ORAL | 0 refills | Status: DC | PRN

## 2016-06-16 MED ORDER — LIDOCAINE HCL (PF) 0.5 % IJ SOLN
INTRAMUSCULAR | Status: DC | PRN
  Administered 2016-06-16: 30 mL via INTRAVENOUS

## 2016-06-16 MED ORDER — BUPIVACAINE HCL (PF) 0.25 % IJ SOLN
INTRAMUSCULAR | Status: DC | PRN
  Administered 2016-06-16: 9 mL

## 2016-06-16 SURGICAL SUPPLY — 32 items
BANDAGE COBAN STERILE 2 (GAUZE/BANDAGES/DRESSINGS) ×3 IMPLANT
BLADE SURG 15 STRL LF DISP TIS (BLADE) ×1 IMPLANT
BLADE SURG 15 STRL SS (BLADE) ×2
BNDG ESMARK 4X9 LF (GAUZE/BANDAGES/DRESSINGS) IMPLANT
CHLORAPREP W/TINT 26ML (MISCELLANEOUS) ×3 IMPLANT
CORDS BIPOLAR (ELECTRODE) IMPLANT
COVER BACK TABLE 60X90IN (DRAPES) ×3 IMPLANT
COVER MAYO STAND STRL (DRAPES) ×3 IMPLANT
CUFF TOURNIQUET SINGLE 18IN (TOURNIQUET CUFF) IMPLANT
DECANTER SPIKE VIAL GLASS SM (MISCELLANEOUS) IMPLANT
DRAPE EXTREMITY T 121X128X90 (DRAPE) ×3 IMPLANT
DRAPE SURG 17X23 STRL (DRAPES) ×3 IMPLANT
GAUZE SPONGE 4X4 12PLY STRL (GAUZE/BANDAGES/DRESSINGS) ×3 IMPLANT
GAUZE XEROFORM 1X8 LF (GAUZE/BANDAGES/DRESSINGS) ×3 IMPLANT
GLOVE BIOGEL PI IND STRL 7.0 (GLOVE) ×2 IMPLANT
GLOVE BIOGEL PI IND STRL 8.5 (GLOVE) ×1 IMPLANT
GLOVE BIOGEL PI INDICATOR 7.0 (GLOVE) ×4
GLOVE BIOGEL PI INDICATOR 8.5 (GLOVE) ×2
GLOVE ECLIPSE 6.5 STRL STRAW (GLOVE) ×3 IMPLANT
GLOVE SURG ORTHO 8.0 STRL STRW (GLOVE) ×3 IMPLANT
GOWN STRL REUS W/ TWL LRG LVL3 (GOWN DISPOSABLE) ×1 IMPLANT
GOWN STRL REUS W/TWL LRG LVL3 (GOWN DISPOSABLE) ×2
GOWN STRL REUS W/TWL XL LVL3 (GOWN DISPOSABLE) ×3 IMPLANT
NEEDLE PRECISIONGLIDE 27X1.5 (NEEDLE) ×3 IMPLANT
NS IRRIG 1000ML POUR BTL (IV SOLUTION) ×3 IMPLANT
PACK BASIN DAY SURGERY FS (CUSTOM PROCEDURE TRAY) ×3 IMPLANT
STOCKINETTE 4X48 STRL (DRAPES) ×3 IMPLANT
SUT ETHILON 4 0 PS 2 18 (SUTURE) ×3 IMPLANT
SYR BULB 3OZ (MISCELLANEOUS) ×3 IMPLANT
SYR CONTROL 10ML LL (SYRINGE) ×3 IMPLANT
TOWEL OR 17X24 6PK STRL BLUE (TOWEL DISPOSABLE) ×3 IMPLANT
UNDERPAD 30X30 (UNDERPADS AND DIAPERS) IMPLANT

## 2016-06-16 NOTE — Transfer of Care (Signed)
Immediate Anesthesia Transfer of Care Note  Patient: Lindsay Fischer  Procedure(s) Performed: Procedure(s) with comments: RELEASE TRIGGER FINGER/A-1 PULLEY RIGHT RING SMALL FINGER (Right) - IV REG/FAB  Patient Location: PACU  Anesthesia Type:MAC and Bier block  Level of Consciousness: awake, alert  and oriented  Airway & Oxygen Therapy: Patient Spontanous Breathing and Patient connected to face mask oxygen  Post-op Assessment: Report given to RN and Post -op Vital signs reviewed and stable  Post vital signs: Reviewed and stable  Last Vitals:  Vitals:   06/16/16 0753  BP: (!) 150/81  Pulse: 82  Resp: 18  Temp: 36.4 C    Last Pain:  Vitals:   06/16/16 0753  TempSrc: Oral  PainSc: 1       Patients Stated Pain Goal: 0 (06/29/15 3567)  Complications: No apparent anesthesia complications

## 2016-06-16 NOTE — Anesthesia Preprocedure Evaluation (Addendum)
Anesthesia Evaluation  Patient identified by MRN, date of birth, ID band Patient awake    Reviewed: Allergy & Precautions, NPO status , Patient's Chart, lab work & pertinent test results  Airway Mallampati: II  TM Distance: >3 FB Neck ROM: Full    Dental no notable dental hx.    Pulmonary neg pulmonary ROS,    Pulmonary exam normal breath sounds clear to auscultation       Cardiovascular negative cardio ROS   Rhythm:Regular Rate:Normal + Systolic murmurs    Neuro/Psych negative neurological ROS  negative psych ROS   GI/Hepatic Neg liver ROS, PUD, GERD  ,  Endo/Other  negative endocrine ROS  Renal/GU negative Renal ROS     Musculoskeletal  (+) Arthritis ,   Abdominal   Peds  Hematology negative hematology ROS (+)   Anesthesia Other Findings   Reproductive/Obstetrics negative OB ROS                            Anesthesia Physical Anesthesia Plan  ASA: III  Anesthesia Plan: MAC and Bier Block   Post-op Pain Management:    Induction: Intravenous  Airway Management Planned:   Additional Equipment:   Intra-op Plan:   Post-operative Plan:   Informed Consent: I have reviewed the patients History and Physical, chart, labs and discussed the procedure including the risks, benefits and alternatives for the proposed anesthesia with the patient or authorized representative who has indicated his/her understanding and acceptance.   Dental advisory given  Plan Discussed with: CRNA  Anesthesia Plan Comments:        Anesthesia Quick Evaluation

## 2016-06-16 NOTE — Brief Op Note (Signed)
06/16/2016  9:19 AM  PATIENT:  Lindsay Fischer  66 y.o. female  PRE-OPERATIVE DIAGNOSIS:  TRIGGER RIGHT RING SMALL FINGER  POST-OPERATIVE DIAGNOSIS:  TRIGGER RIGHT RING SMALL FINGER  PROCEDURE:  Procedure(s) with comments: RELEASE TRIGGER FINGER/A-1 PULLEY RIGHT RING SMALL FINGER (Right) - IV REG/FAB  SURGEON:  Surgeon(s) and Role:    * Cindee SaltGary Myrel Rappleye, MD - Primary  PHYSICIAN ASSISTANT:   ASSISTANTS: none   ANESTHESIA:   local and regional  EBL:  Total I/O In: 200 [I.V.:200] Out: 5 [Blood:5]  BLOOD ADMINISTERED:none  DRAINS: none   LOCAL MEDICATIONS USED:  BUPIVICAINE   SPECIMEN:  No Specimen  DISPOSITION OF SPECIMEN:  N/A  COUNTS:  YES  TOURNIQUET:   Total Tourniquet Time Documented: Forearm (Right) - 23 minutes Total: Forearm (Right) - 23 minutes   DICTATION: .Other Dictation: Dictation Number [id number]  PLAN OF CARE: Discharge to home after PACU  PATIENT DISPOSITION:  PACU - hemodynamically stable.

## 2016-06-16 NOTE — Op Note (Signed)
Dictation Number [id number]

## 2016-06-16 NOTE — H&P (Signed)
Lindsay Fischer is an 66 y.o. female.   Chief Complaint: catching ring and small fingers right hand HPI: Lindsay Fischer is a 66 yo female with catching  of her right ring and small fingersr.  This is been injected on 2 occasions.. She states however that she will occasionally have catching and relates this to multiple fingers on both hands. He has no  history of injury. She is not taking anything for this at the present time. She is complaining some cramping in her feet also. She is asked if she is on a statin. She states that she was on sugar and cholesterol medicine but stopped taking them. She is not being awakened at night. She has had a carpal tunnel release in the past on her right side. She is not presently complaining of numbness or tingling.      Past Medical History:  Diagnosis Date  . Arthritis    shoulders  . Gastric ulcer   . GERD (gastroesophageal reflux disease)   . History of asthma    as a child  . History of kidney stones   . Obesity   . Osteoarthritis    bilateral knee  . Right trigger finger 05/2016   ring and small fingers  . Wears dentures    upper    Past Surgical History:  Procedure Laterality Date  . CARPAL TUNNEL RELEASE Right   . CHOLECYSTECTOMY    . COLONOSCOPY WITH PROPOFOL  06/09/2012  . ESOPHAGOGASTRODUODENOSCOPY (EGD) WITH ESOPHAGEAL DILATION  05/27/2012   with Propofol  . KIDNEY STONE SURGERY    . LITHOTRIPSY  03/27/2016  . LITHOTRIPSY    . ORIF FINGER FRACTURE Left    small finger  . TOTAL KNEE ARTHROPLASTY Bilateral   . TRIGGER FINGER RELEASE Right    middle finger  . TUBAL LIGATION      Family History  Problem Relation Age of Onset  . Adopted: Yes   Social History:  reports that she has never smoked. She has never used smokeless tobacco. She reports that she drinks alcohol. She reports that she does not use drugs.  Allergies:  Allergies  Allergen Reactions  . Adhesive [Tape] Other (See Comments)    BLISTERS  . Penicillins Hives  .  Codeine Nausea And Vomiting    chills    No prescriptions prior to admission.    No results found for this or any previous visit (from the past 48 hour(s)).  No results found.   Pertinent items are noted in HPI.  Height 4' 10.75" (1.492 m), weight 94.8 kg (209 lb).  General appearance: alert, cooperative and appears stated age Head: Normocephalic, without obvious abnormality Neck: no JVD Resp: clear to auscultation bilaterally Cardio: regular rate and rhythm, S1, S2 normal, no murmur, click, rub or gallop GI: soft, non-tender; bowel sounds normal; no masses,  no organomegaly Extremities: catching ring and small fingers right hand Pulses: 2+ and symmetric Skin: Skin color, texture, turgor normal. No rashes or lesions Neurologic: Grossly normal Incision/Wound: na  Assessment/Plan Assessment:  1. Trigger ring finger of right hand  2. Trigger little finger of right hand    Plan: She would like to proceed to have these surgically released on her right side.. Pre-peri-and postoperative course been discussed along with risks and complications. She is aware that there is no guarantee to the surgery the possibility of infection recurrence injury to arteries nerves tendons incomplete relief of symptoms and dystrophy. She is scheduled for release A1 pulley right ring  and right small finger as an outpatient under regional anesthesia.      Jamin Panther R 06/16/2016, 4:37 AM

## 2016-06-16 NOTE — Anesthesia Postprocedure Evaluation (Signed)
Anesthesia Post Note  Patient: Lindsay Fischer  Procedure(s) Performed: Procedure(s) (LRB): RELEASE TRIGGER FINGER/A-1 PULLEY RIGHT RING SMALL FINGER (Right)  Patient location during evaluation: PACU Anesthesia Type: MAC and Bier Block Level of consciousness: awake and alert Pain management: pain level controlled Vital Signs Assessment: post-procedure vital signs reviewed and stable Respiratory status: spontaneous breathing Cardiovascular status: stable Anesthetic complications: no       Last Vitals:  Vitals:   06/16/16 1000 06/16/16 1009  BP: 132/67 130/66  Pulse: 92 99  Resp: 14 16  Temp:  36.7 C    Last Pain:  Vitals:   06/16/16 1009  TempSrc:   PainSc: 0-No pain                 Nolon Nations

## 2016-06-16 NOTE — Discharge Instructions (Addendum)

## 2016-06-17 ENCOUNTER — Encounter (HOSPITAL_BASED_OUTPATIENT_CLINIC_OR_DEPARTMENT_OTHER): Payer: Self-pay | Admitting: Orthopedic Surgery

## 2016-06-17 NOTE — Op Note (Signed)
NAMLandry Corporal:  Lindsay Fischer, Lindsay Fischer              ACCOUNT NO.:  [id number]  MEDICAL RECORD NO.:  [id number]  LOCATION:                                 FACILITY:  PHYSICIAN:  Cindee SaltGary Marcena Dias, M.D.            DATE OF BIRTH:  DATE OF PROCEDURE:  06/16/2016 DATE OF DISCHARGE:                              OPERATIVE REPORT   PREOPERATIVE DIAGNOSIS:  Stenosing tenosynovitis, right ring, right small fingers.  POSTOPERATIVE DIAGNOSIS:  Stenosing tenosynovitis, right ring, right small fingers.  OPERATION:  Release of A1 pulley, right ring, right small fingers.  SURGEON:  Cindee SaltGary Viyaan Champine, M.D.  ANESTHESIA:  Forearm-based IV regional with local infiltration.  HISTORY:  The patient is a 66 year old female with a history of triggering of her right ring and small fingers.  This has not responded to conservative treatment.  She has elected to undergo surgical release of the A1 pulley of each finger.  Pre, peri and postoperative course have been discussed along with risks and complications.  She is aware that there was no guarantee to the surgery; the possibility of infection; recurrence of injury to arteries, nerves, tendons; incomplete relief of symptoms and dystrophy.  In the preoperative area, the patient is seen, the extremity marked by both patient and surgeon, and antibiotic given.  PROCEDURE IN DETAIL:  The patient was brought to the operating room, where a forearm-based IV regional anesthetic was carried out without difficulty under the direction of the Anesthesia Department.  She was prepped using ChloraPrep in supine position with the right arm free.  A 3-minute dry time was allowed, and the time-out taken confirming the patient and procedure.  Oblique incision was made over the right ring finger of A1 pulley.  This was carried down through subcutaneous tissue. Bleeders were electrocauterized with bipolar.  Retractors were placed protecting the neurovascular bundles radially and ulnarly.  The  flexor tendons were identified along with the A1 pulley.  The A1 pulley was released on its radial aspect.  A small incision was made centrally in A2.  Partial tenosynovectomy was performed proximally with separation of the two tendons to remove any adhesions.  The finger was placed through a full passive range of motion, no further triggering was noted.  A separate incision was then made over the small finger of A1 pulley. This was again carried down through the subcutaneous tissue.  Bleeders were again electrocauterized with bipolar.  Retractors were placed protecting the neurovascular bundles radially and ulnarly.  The A1 pulley was identified and released on its radial aspect.  A small incision was made centrally in A2.  Partial tenosynovectomy performed proximally.  Two tendons were separated removing any adhesions between the two tendons.  The finger placed through a full passive range of motion, no further triggering was noted.  The wounds were copiously irrigated with saline and each wound was closed with interrupted 4-0 nylon sutures.  A local infiltration with 0.25% bupivacaine without epinephrine was given.  A total of 9 mL was used.  A sterile compressive dressing with the fingers free was applied.  On deflation of the tourniquet, all fingers were immediately pinked.  She was taken to  the recovery room for observation in satisfactory condition.  She will be discharged to home to return to the [facility] in 1 week, on [facility].          ______________________________ Cindee Salt, M.D.     GK/MEDQ  D:  06/16/2016  T:  06/17/2016  Job:  [id number]

## 2018-12-01 ENCOUNTER — Other Ambulatory Visit: Payer: Self-pay | Admitting: Internal Medicine

## 2018-12-01 DIAGNOSIS — Z20822 Contact with and (suspected) exposure to covid-19: Secondary | ICD-10-CM

## 2018-12-06 LAB — NOVEL CORONAVIRUS, NAA: SARS-CoV-2, NAA: DETECTED — AB

## 2019-01-13 ENCOUNTER — Other Ambulatory Visit: Payer: Self-pay

## 2019-01-13 DIAGNOSIS — Z20822 Contact with and (suspected) exposure to covid-19: Secondary | ICD-10-CM

## 2019-01-14 LAB — NOVEL CORONAVIRUS, NAA: SARS-CoV-2, NAA: NOT DETECTED

## 2020-03-30 ENCOUNTER — Emergency Department (HOSPITAL_COMMUNITY): Payer: Medicare Other

## 2020-03-30 ENCOUNTER — Other Ambulatory Visit: Payer: Self-pay

## 2020-03-30 ENCOUNTER — Emergency Department (HOSPITAL_COMMUNITY)
Admission: EM | Admit: 2020-03-30 | Discharge: 2020-03-31 | Disposition: A | Discharge status: Home / Self Care | Payer: Medicare Other | Attending: Emergency Medicine | Admitting: Emergency Medicine

## 2020-03-30 DIAGNOSIS — J40 Bronchitis, not specified as acute or chronic: Secondary | ICD-10-CM | POA: Diagnosis not present

## 2020-03-30 DIAGNOSIS — Z96643 Presence of artificial hip joint, bilateral: Secondary | ICD-10-CM | POA: Insufficient documentation

## 2020-03-30 DIAGNOSIS — R0602 Shortness of breath: Secondary | ICD-10-CM | POA: Diagnosis present

## 2020-03-30 LAB — CBC WITH DIFFERENTIAL/PLATELET
Abs Immature Granulocytes: 0.03 10*3/uL (ref 0.00–0.07)
Basophils Absolute: 0.1 10*3/uL (ref 0.0–0.1)
Basophils Relative: 1 %
Eosinophils Absolute: 0.7 10*3/uL — ABNORMAL HIGH (ref 0.0–0.5)
Eosinophils Relative: 7 %
HCT: 42.4 % (ref 36.0–46.0)
Hemoglobin: 13 g/dL (ref 12.0–15.0)
Immature Granulocytes: 0 %
Lymphocytes Relative: 35 %
Lymphs Abs: 3.7 10*3/uL (ref 0.7–4.0)
MCH: 27.1 pg (ref 26.0–34.0)
MCHC: 30.7 g/dL (ref 30.0–36.0)
MCV: 88.5 fL (ref 80.0–100.0)
Monocytes Absolute: 0.9 10*3/uL (ref 0.1–1.0)
Monocytes Relative: 9 %
Neutro Abs: 5.2 10*3/uL (ref 1.7–7.7)
Neutrophils Relative %: 48 %
Platelets: 264 10*3/uL (ref 150–400)
RBC: 4.79 MIL/uL (ref 3.87–5.11)
RDW: 13.9 % (ref 11.5–15.5)
WBC: 10.5 10*3/uL (ref 4.0–10.5)
nRBC: 0 % (ref 0.0–0.2)

## 2020-03-30 MED ORDER — IPRATROPIUM BROMIDE 0.02 % IN SOLN
0.5000 mg | Freq: Once | RESPIRATORY_TRACT | Status: AC
  Administered 2020-03-30: 0.5 mg via RESPIRATORY_TRACT
  Filled 2020-03-30: qty 2.5

## 2020-03-30 MED ORDER — ALBUTEROL SULFATE (2.5 MG/3ML) 0.083% IN NEBU
5.0000 mg | INHALATION_SOLUTION | Freq: Once | RESPIRATORY_TRACT | Status: AC
  Administered 2020-03-30: 5 mg via RESPIRATORY_TRACT
  Filled 2020-03-30: qty 6

## 2020-03-30 NOTE — ED Triage Notes (Signed)
Patient reports she developed shobr and coughing Monday, went to [facility] and was negative for all tests including strep, flu, covid. Patient says cough getting worse. Patient says more blood when coughs.

## 2020-03-30 NOTE — ED Provider Notes (Signed)
[name] Provider Note   CSN: [id number] Arrival date & time: 03/30/20  1816     History Chief Complaint  Patient presents with  . Shortness of Breath  . Cough    Lindsay Fischer is a 69 y.o. female.  The history is provided by the patient and medical records.  Shortness of Breath Associated symptoms: cough   Cough Associated symptoms: shortness of breath     69 y.o. 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  Patient states she initially saw her PCP 2 weeks ago, had covid testing that was negative and was given allergy medication.  States symptoms persisted so she went back, had repeat covid test along with flu and strep testing that was negative.  She was then started on a z-pack which she has completed without improvement.  States 2 days ago she started coughing up some blood and SOB has worsened.  She reports she gets extremely winded just trying to walk around her house which is not normal.  She denies fever/chills.  Denies new sick contacts.  Denies smoking history or known COPD.  Past Medical History:  Diagnosis Date  . Arthritis    shoulders  . Gastric ulcer   . GERD (gastroesophageal reflux disease)   . History of asthma    as a child  . History of kidney stones   . Obesity   . Osteoarthritis    bilateral knee  . Right trigger finger 05/2016   ring and small fingers  . Wears dentures    upper    Patient Active Problem List   Diagnosis Date Noted  . Dysphagia, unspecified(787.20) 04/13/2012  . Stricture and stenosis of esophagus 04/13/2012  . Esophageal reflux 04/13/2012  . Special screening for malignant neoplasms, colon 04/13/2012    Past Surgical History:  Procedure Laterality Date  . CARPAL TUNNEL RELEASE Right   . CHOLECYSTECTOMY    . COLONOSCOPY WITH PROPOFOL  06/09/2012  . ESOPHAGOGASTRODUODENOSCOPY (EGD) WITH ESOPHAGEAL  DILATION  05/27/2012   with Propofol  . KIDNEY STONE SURGERY    . LITHOTRIPSY  03/27/2016  . LITHOTRIPSY    . ORIF FINGER FRACTURE Left    small finger  . TOTAL KNEE ARTHROPLASTY Bilateral   . TRIGGER FINGER RELEASE Right    middle finger  . TRIGGER FINGER RELEASE Right 06/16/2016   Procedure: RELEASE TRIGGER FINGER/A-1 PULLEY RIGHT RING SMALL FINGER;  Surgeon: Cindee Salt, MD;  Location: [facility];  Service: Orthopedics;  Laterality: Right;  IV REG/FAB  . TUBAL LIGATION       OB History   No obstetric history on file.     Family History  Adopted: Yes    Social History   Tobacco Use  . Smoking status: Never Smoker  . Smokeless tobacco: Never Used  Substance Use Topics  . Alcohol use: Yes    Comment: rarely  . Drug use: No    Home Medications Prior to Admission medications   Medication Sig Start Date End Date Taking? Authorizing Provider  oxybutynin (DITROPAN-XL) 5 MG 24 hr tablet Take 5 mg by mouth daily.    [provider]  predniSONE (DELTASONE) 20 MG tablet Take 2 tablets (40 mg total) by mouth daily. 11/19/12   Antony Madura, PA-C  traMADol (ULTRAM) 50 MG tablet Take 1 tablet (50 mg total) by mouth every 6 (six) hours as needed. 06/16/16  Cindee Salt, MD    Allergies    Adhesive [tape], Penicillins, and Codeine  Review of Systems   Review of Systems  Respiratory: Positive for cough and shortness of breath.   All other systems reviewed and are negative.   Physical Exam Updated Vital Signs BP (!) 143/69 (BP Location: Left Arm)   Pulse (!) 109   Temp 98.6 F (37 C) (Oral)   Resp (!) 24   Ht 4\' 10"  (1.473 m)   Wt 100.2 kg   SpO2 96%   BMI 46.19 kg/m   Physical Exam Vitals and nursing note reviewed.  Constitutional:      Appearance: She is well-developed.  HENT:     Head: Normocephalic and atraumatic.  Eyes:     Conjunctiva/sclera: Conjunctivae normal.     Pupils: Pupils are equal, round, and reactive to light.    Cardiovascular:     Rate and Rhythm: Normal rate and regular rhythm.     Heart sounds: Normal heart sounds.  Pulmonary:     Effort: Pulmonary effort is normal.     Breath sounds: Wheezing present.     Comments: Some scattered expiratory wheezes noted, dry cough, spitting frequently during exam, no gross hemoptysis observed, O2 sats 97% during exam Abdominal:     General: Bowel sounds are normal.     Palpations: Abdomen is soft.  Musculoskeletal:        General: Normal range of motion.     Cervical back: Normal range of motion.  Skin:    General: Skin is warm and dry.  Neurological:     Mental Status: She is alert and oriented to person, place, and time.     ED Results / Procedures / Treatments   Labs (all labs ordered are listed, but only abnormal results are displayed) Labs Reviewed  CBC WITH DIFFERENTIAL/PLATELET - Abnormal; Notable for the following components:      Result Value   Eosinophils Absolute 0.7 (*)    All other components within normal limits  BASIC METABOLIC PANEL - Abnormal; Notable for the following components:   Sodium 146 (*)    Potassium 2.9 (*)    BUN 6 (*)    Calcium 8.0 (*)    All other components within normal limits  TROPONIN I (HIGH SENSITIVITY)    EKG None  Radiology DG Chest 2 View  Result Date: 03/30/2020 CLINICAL DATA:  Short of breath and cough for 1 week EXAM: CHEST - 2 VIEW COMPARISON:  11/19/2012 FINDINGS: Frontal and lateral views of the chest demonstrate an unremarkable cardiac silhouette. No airspace disease, effusion, or pneumothorax. Severe left shoulder osteoarthritis. Unremarkable right shoulder arthroplasty. IMPRESSION: 1. No acute intrathoracic process. Electronically Signed   By: 01/20/2013 M.D.   On: 03/30/2020 19:14   CT Angio Chest PE W and/or Wo Contrast  Result Date: 03/31/2020 CLINICAL DATA:  Shortness of breath and hemoptysis EXAM: CT ANGIOGRAPHY CHEST WITH CONTRAST TECHNIQUE: Multidetector CT imaging of the  chest was performed using the standard protocol during bolus administration of intravenous contrast. Multiplanar CT image reconstructions and MIPs were obtained to evaluate the vascular anatomy. CONTRAST:  04/02/2020 OMNIPAQUE IOHEXOL 350 MG/ML SOLN COMPARISON:  None. FINDINGS: Cardiovascular: Contrast injection is sufficient to demonstrate satisfactory opacification of the pulmonary arteries to the segmental level. There is no pulmonary embolus or evidence of right heart strain. The size of the main pulmonary artery is normal. Heart size is normal, with no pericardial effusion. The course and caliber of the aorta  are normal. There is no atherosclerotic calcification. Opacification decreased due to pulmonary arterial phase contrast bolus timing. Mediastinum/Nodes: No mediastinal, hilar or axillary lymphadenopathy. Normal visualized thyroid. Thoracic esophageal course is normal. Lungs/Pleura: Airways are patent. No pleural effusion, lobar consolidation, pneumothorax or pulmonary infarction. Upper Abdomen: Contrast bolus timing is not optimized for evaluation of the abdominal organs. The visualized portions of the organs of the upper abdomen are normal. Musculoskeletal: No chest wall abnormality. No bony spinal canal stenosis. Review of the MIP images confirms the above findings. IMPRESSION: No pulmonary embolus or other acute thoracic abnormality. Electronically Signed   By: Deatra Robinson M.D.   On: 03/31/2020 01:16    Procedures Procedures (including critical care time)  Medications Ordered in ED Medications  sodium chloride (PF) 0.9 % injection (  Not Given 03/31/20 0023)  albuterol (PROVENTIL) (2.5 MG/3ML) 0.083% nebulizer solution 5 mg (5 mg Nebulization Given 03/30/20 2307)  ipratropium (ATROVENT) nebulizer solution 0.5 mg (0.5 mg Nebulization Given 03/30/20 2316)  iohexol (OMNIPAQUE) 350 MG/ML injection 100 mL (100 mLs Intravenous Contrast Given 03/31/20 0031)  potassium chloride SA (KLOR-CON) CR tablet  40 mEq (40 mEq Oral Given 03/31/20 0150)    ED Course  I have reviewed the triage vital signs and the nursing notes.  Pertinent labs & imaging results that were available during my care of the patient were reviewed by me and considered in my medical decision making (see chart for details).    MDM Rules/Calculators/A&P  69 y.o. F here with SOB and cough x2 weeks.  Negative covid, flu, and strep testing this week.  Symptoms worsened 2 days ago with new hemoptysis and despite course of azithromycin.  She is afebrile, non-toxic.  She is coughing during exam and spitting but no gross hemoptysis observed.  Does have some expiratory wheezes as well but in no acute respiratory distress, maintaining oxygen saturations on RA.  CXR obtained from triage, no acute findings.  She does have history of recurrent PE but is not currently on anticoagulation.  Will obtain labs, EKG, and CTA of chest to assess for PE +/- occult pneumonia.  Given duoneb.  Labs as above, does have mild hypokalemia, given oral replacement.  Troponin is negative.  CTA negative for PE or occult pneumonia.  Patient reports she is feeling better after DuoNeb.  Given lack of fever or leukocytosis, along with no response to abx already, suspect this is likely a viral bronchitis.  Able to ambulate here and maintain saturations of 93% or higher.  Feel she is stable for discharge home with symptomatic care.    Close follow-up with PCP.  Return here for any new/acute changes.  Final Clinical Impression(s) / ED Diagnoses Final diagnoses:  Bronchitis    Rx / DC Orders ED Discharge Orders         Ordered    benzonatate (TESSALON) 100 MG capsule  Every 8 hours        03/31/20 0211    predniSONE (DELTASONE) 20 MG tablet        03/31/20 0211    albuterol (VENTOLIN HFA) 108 (90 Base) MCG/ACT inhaler  Every 4 hours PRN        03/31/20 0211           Garlon Hatchet, PA-C 03/31/20 0214    Milagros Loll, MD 03/31/20 2047

## 2020-03-31 ENCOUNTER — Emergency Department (HOSPITAL_COMMUNITY): Payer: Medicare Other

## 2020-03-31 ENCOUNTER — Encounter (HOSPITAL_COMMUNITY): Payer: Self-pay

## 2020-03-31 DIAGNOSIS — J40 Bronchitis, not specified as acute or chronic: Secondary | ICD-10-CM | POA: Diagnosis not present

## 2020-03-31 LAB — BASIC METABOLIC PANEL
Anion gap: 11 (ref 5–15)
BUN: 6 mg/dL — ABNORMAL LOW (ref 8–23)
CO2: 24 mmol/L (ref 22–32)
Calcium: 8 mg/dL — ABNORMAL LOW (ref 8.9–10.3)
Chloride: 111 mmol/L (ref 98–111)
Creatinine, Ser: 0.52 mg/dL (ref 0.44–1.00)
GFR, Estimated: >60 mL/min (ref >60)
Glucose, Bld: 96 mg/dL (ref 70–99)
Potassium: 2.9 mmol/L — ABNORMAL LOW (ref 3.5–5.1)
Sodium: 146 mmol/L — ABNORMAL HIGH (ref 135–145)

## 2020-03-31 LAB — TROPONIN I (HIGH SENSITIVITY): Troponin I (High Sensitivity): 8 ng/L (ref <18)

## 2020-03-31 MED ORDER — ALBUTEROL SULFATE HFA 108 (90 BASE) MCG/ACT IN AERS: 2.0000 | INHALATION_SPRAY | RESPIRATORY_TRACT | 0 refills | Status: AC | PRN

## 2020-03-31 MED ORDER — SODIUM CHLORIDE (PF) 0.9 % IJ SOLN
INTRAMUSCULAR | Status: AC
  Filled 2020-03-31: qty 50

## 2020-03-31 MED ORDER — BENZONATATE 100 MG PO CAPS: 100.0000 mg | ORAL_CAPSULE | Freq: Three times a day (TID) | ORAL | 0 refills | Status: AC

## 2020-03-31 MED ORDER — PREDNISONE 20 MG PO TABS: ORAL_TABLET | ORAL | 0 refills | Status: DC

## 2020-03-31 MED ORDER — POTASSIUM CHLORIDE CRYS ER 20 MEQ PO TBCR
40.0000 meq | EXTENDED_RELEASE_TABLET | Freq: Once | ORAL | Status: AC
  Administered 2020-03-31: 40 meq via ORAL
  Filled 2020-03-31: qty 2

## 2020-03-31 MED ORDER — IOHEXOL 350 MG/ML SOLN
100.0000 mL | Freq: Once | INTRAVENOUS | Status: AC | PRN
  Administered 2020-03-31: 100 mL via INTRAVENOUS

## 2020-03-31 NOTE — ED Notes (Signed)
Pt's resting HR around 102-105 and oxygen saturation 97-98%.  Pt was ambulated while monitoring both: pulse increased to 125 and oxygen maintained at or above 93%.

## 2020-03-31 NOTE — Discharge Instructions (Signed)
Take the prescribed medication as directed. °Make sure to drink fluids and stay hydrated. °Follow-up with your primary care doctor. °Return to the ED for new or worsening symptoms. °

## 2020-05-30 DIAGNOSIS — Z7901 Long term (current) use of anticoagulants: Secondary | ICD-10-CM | POA: Diagnosis not present

## 2020-05-30 DIAGNOSIS — J209 Acute bronchitis, unspecified: Secondary | ICD-10-CM | POA: Diagnosis not present

## 2020-05-30 DIAGNOSIS — R0689 Other abnormalities of breathing: Secondary | ICD-10-CM | POA: Diagnosis not present

## 2020-05-30 DIAGNOSIS — Z01818 Encounter for other preprocedural examination: Secondary | ICD-10-CM | POA: Diagnosis not present

## 2020-05-30 DIAGNOSIS — R059 Cough, unspecified: Secondary | ICD-10-CM | POA: Diagnosis not present

## 2020-06-06 DIAGNOSIS — J45909 Unspecified asthma, uncomplicated: Secondary | ICD-10-CM | POA: Diagnosis not present

## 2020-06-06 DIAGNOSIS — J309 Allergic rhinitis, unspecified: Secondary | ICD-10-CM | POA: Diagnosis not present

## 2020-06-11 DIAGNOSIS — J302 Other seasonal allergic rhinitis: Secondary | ICD-10-CM | POA: Diagnosis not present

## 2020-06-11 DIAGNOSIS — K219 Gastro-esophageal reflux disease without esophagitis: Secondary | ICD-10-CM | POA: Diagnosis not present

## 2020-06-11 DIAGNOSIS — J4540 Moderate persistent asthma, uncomplicated: Secondary | ICD-10-CM | POA: Diagnosis not present

## 2020-06-11 DIAGNOSIS — J3089 Other allergic rhinitis: Secondary | ICD-10-CM | POA: Diagnosis not present

## 2020-06-13 DIAGNOSIS — E119 Type 2 diabetes mellitus without complications: Secondary | ICD-10-CM | POA: Diagnosis not present

## 2020-06-13 DIAGNOSIS — Z0100 Encounter for examination of eyes and vision without abnormal findings: Secondary | ICD-10-CM | POA: Diagnosis not present

## 2020-06-19 DIAGNOSIS — M12812 Other specific arthropathies, not elsewhere classified, left shoulder: Secondary | ICD-10-CM | POA: Diagnosis not present

## 2020-06-19 DIAGNOSIS — M75102 Unspecified rotator cuff tear or rupture of left shoulder, not specified as traumatic: Secondary | ICD-10-CM | POA: Diagnosis not present

## 2020-06-28 DIAGNOSIS — Z96611 Presence of right artificial shoulder joint: Secondary | ICD-10-CM | POA: Diagnosis not present

## 2020-06-28 DIAGNOSIS — N189 Chronic kidney disease, unspecified: Secondary | ICD-10-CM | POA: Diagnosis not present

## 2020-06-28 DIAGNOSIS — Z96612 Presence of left artificial shoulder joint: Secondary | ICD-10-CM | POA: Diagnosis not present

## 2020-06-28 DIAGNOSIS — K219 Gastro-esophageal reflux disease without esophagitis: Secondary | ICD-10-CM | POA: Diagnosis not present

## 2020-06-28 DIAGNOSIS — I129 Hypertensive chronic kidney disease with stage 1 through stage 4 chronic kidney disease, or unspecified chronic kidney disease: Secondary | ICD-10-CM | POA: Diagnosis not present

## 2020-06-28 DIAGNOSIS — Z20822 Contact with and (suspected) exposure to covid-19: Secondary | ICD-10-CM | POA: Diagnosis not present

## 2020-06-28 DIAGNOSIS — M75122 Complete rotator cuff tear or rupture of left shoulder, not specified as traumatic: Secondary | ICD-10-CM | POA: Diagnosis not present

## 2020-06-28 DIAGNOSIS — E1122 Type 2 diabetes mellitus with diabetic chronic kidney disease: Secondary | ICD-10-CM | POA: Diagnosis not present

## 2020-06-28 DIAGNOSIS — Z6841 Body Mass Index (BMI) 40.0 and over, adult: Secondary | ICD-10-CM | POA: Diagnosis not present

## 2020-06-28 DIAGNOSIS — M19012 Primary osteoarthritis, left shoulder: Secondary | ICD-10-CM | POA: Diagnosis not present

## 2020-06-28 DIAGNOSIS — Z471 Aftercare following joint replacement surgery: Secondary | ICD-10-CM | POA: Diagnosis not present

## 2020-06-28 DIAGNOSIS — E785 Hyperlipidemia, unspecified: Secondary | ICD-10-CM | POA: Diagnosis not present

## 2020-06-28 DIAGNOSIS — G8918 Other acute postprocedural pain: Secondary | ICD-10-CM | POA: Diagnosis not present

## 2020-07-01 DIAGNOSIS — N189 Chronic kidney disease, unspecified: Secondary | ICD-10-CM | POA: Diagnosis not present

## 2020-07-01 DIAGNOSIS — Z96612 Presence of left artificial shoulder joint: Secondary | ICD-10-CM | POA: Diagnosis not present

## 2020-07-01 DIAGNOSIS — Z471 Aftercare following joint replacement surgery: Secondary | ICD-10-CM | POA: Diagnosis not present

## 2020-07-01 DIAGNOSIS — J45909 Unspecified asthma, uncomplicated: Secondary | ICD-10-CM | POA: Diagnosis not present

## 2020-07-01 DIAGNOSIS — I129 Hypertensive chronic kidney disease with stage 1 through stage 4 chronic kidney disease, or unspecified chronic kidney disease: Secondary | ICD-10-CM | POA: Diagnosis not present

## 2020-07-01 DIAGNOSIS — E785 Hyperlipidemia, unspecified: Secondary | ICD-10-CM | POA: Diagnosis not present

## 2020-07-01 DIAGNOSIS — D631 Anemia in chronic kidney disease: Secondary | ICD-10-CM | POA: Diagnosis not present

## 2020-07-01 DIAGNOSIS — J449 Chronic obstructive pulmonary disease, unspecified: Secondary | ICD-10-CM | POA: Diagnosis not present

## 2020-07-01 DIAGNOSIS — M1990 Unspecified osteoarthritis, unspecified site: Secondary | ICD-10-CM | POA: Diagnosis not present

## 2020-07-01 DIAGNOSIS — E1122 Type 2 diabetes mellitus with diabetic chronic kidney disease: Secondary | ICD-10-CM | POA: Diagnosis not present

## 2020-07-04 DIAGNOSIS — M1990 Unspecified osteoarthritis, unspecified site: Secondary | ICD-10-CM | POA: Diagnosis not present

## 2020-07-04 DIAGNOSIS — I129 Hypertensive chronic kidney disease with stage 1 through stage 4 chronic kidney disease, or unspecified chronic kidney disease: Secondary | ICD-10-CM | POA: Diagnosis not present

## 2020-07-04 DIAGNOSIS — N189 Chronic kidney disease, unspecified: Secondary | ICD-10-CM | POA: Diagnosis not present

## 2020-07-04 DIAGNOSIS — E785 Hyperlipidemia, unspecified: Secondary | ICD-10-CM | POA: Diagnosis not present

## 2020-07-04 DIAGNOSIS — J449 Chronic obstructive pulmonary disease, unspecified: Secondary | ICD-10-CM | POA: Diagnosis not present

## 2020-07-04 DIAGNOSIS — Z96612 Presence of left artificial shoulder joint: Secondary | ICD-10-CM | POA: Diagnosis not present

## 2020-07-04 DIAGNOSIS — D631 Anemia in chronic kidney disease: Secondary | ICD-10-CM | POA: Diagnosis not present

## 2020-07-04 DIAGNOSIS — E1122 Type 2 diabetes mellitus with diabetic chronic kidney disease: Secondary | ICD-10-CM | POA: Diagnosis not present

## 2020-07-04 DIAGNOSIS — Z471 Aftercare following joint replacement surgery: Secondary | ICD-10-CM | POA: Diagnosis not present

## 2020-07-04 DIAGNOSIS — J45909 Unspecified asthma, uncomplicated: Secondary | ICD-10-CM | POA: Diagnosis not present

## 2020-07-10 DIAGNOSIS — E785 Hyperlipidemia, unspecified: Secondary | ICD-10-CM | POA: Diagnosis not present

## 2020-07-10 DIAGNOSIS — I129 Hypertensive chronic kidney disease with stage 1 through stage 4 chronic kidney disease, or unspecified chronic kidney disease: Secondary | ICD-10-CM | POA: Diagnosis not present

## 2020-07-10 DIAGNOSIS — Z471 Aftercare following joint replacement surgery: Secondary | ICD-10-CM | POA: Diagnosis not present

## 2020-07-10 DIAGNOSIS — E1122 Type 2 diabetes mellitus with diabetic chronic kidney disease: Secondary | ICD-10-CM | POA: Diagnosis not present

## 2020-07-10 DIAGNOSIS — J45909 Unspecified asthma, uncomplicated: Secondary | ICD-10-CM | POA: Diagnosis not present

## 2020-07-10 DIAGNOSIS — M1990 Unspecified osteoarthritis, unspecified site: Secondary | ICD-10-CM | POA: Diagnosis not present

## 2020-07-10 DIAGNOSIS — D631 Anemia in chronic kidney disease: Secondary | ICD-10-CM | POA: Diagnosis not present

## 2020-07-10 DIAGNOSIS — N189 Chronic kidney disease, unspecified: Secondary | ICD-10-CM | POA: Diagnosis not present

## 2020-07-10 DIAGNOSIS — J449 Chronic obstructive pulmonary disease, unspecified: Secondary | ICD-10-CM | POA: Diagnosis not present

## 2020-07-10 DIAGNOSIS — Z96612 Presence of left artificial shoulder joint: Secondary | ICD-10-CM | POA: Diagnosis not present

## 2020-07-11 DIAGNOSIS — Z96612 Presence of left artificial shoulder joint: Secondary | ICD-10-CM | POA: Diagnosis not present

## 2020-07-11 DIAGNOSIS — Z471 Aftercare following joint replacement surgery: Secondary | ICD-10-CM | POA: Diagnosis not present

## 2020-07-12 DIAGNOSIS — J45909 Unspecified asthma, uncomplicated: Secondary | ICD-10-CM | POA: Diagnosis not present

## 2020-07-12 DIAGNOSIS — Z96612 Presence of left artificial shoulder joint: Secondary | ICD-10-CM | POA: Diagnosis not present

## 2020-07-12 DIAGNOSIS — I129 Hypertensive chronic kidney disease with stage 1 through stage 4 chronic kidney disease, or unspecified chronic kidney disease: Secondary | ICD-10-CM | POA: Diagnosis not present

## 2020-07-12 DIAGNOSIS — D631 Anemia in chronic kidney disease: Secondary | ICD-10-CM | POA: Diagnosis not present

## 2020-07-12 DIAGNOSIS — J449 Chronic obstructive pulmonary disease, unspecified: Secondary | ICD-10-CM | POA: Diagnosis not present

## 2020-07-12 DIAGNOSIS — E785 Hyperlipidemia, unspecified: Secondary | ICD-10-CM | POA: Diagnosis not present

## 2020-07-12 DIAGNOSIS — Z471 Aftercare following joint replacement surgery: Secondary | ICD-10-CM | POA: Diagnosis not present

## 2020-07-12 DIAGNOSIS — E1122 Type 2 diabetes mellitus with diabetic chronic kidney disease: Secondary | ICD-10-CM | POA: Diagnosis not present

## 2020-07-12 DIAGNOSIS — N189 Chronic kidney disease, unspecified: Secondary | ICD-10-CM | POA: Diagnosis not present

## 2020-07-12 DIAGNOSIS — M1990 Unspecified osteoarthritis, unspecified site: Secondary | ICD-10-CM | POA: Diagnosis not present

## 2020-07-15 DIAGNOSIS — Z471 Aftercare following joint replacement surgery: Secondary | ICD-10-CM | POA: Diagnosis not present

## 2020-07-15 DIAGNOSIS — D631 Anemia in chronic kidney disease: Secondary | ICD-10-CM | POA: Diagnosis not present

## 2020-07-15 DIAGNOSIS — N189 Chronic kidney disease, unspecified: Secondary | ICD-10-CM | POA: Diagnosis not present

## 2020-07-15 DIAGNOSIS — E785 Hyperlipidemia, unspecified: Secondary | ICD-10-CM | POA: Diagnosis not present

## 2020-07-15 DIAGNOSIS — E1122 Type 2 diabetes mellitus with diabetic chronic kidney disease: Secondary | ICD-10-CM | POA: Diagnosis not present

## 2020-07-15 DIAGNOSIS — J449 Chronic obstructive pulmonary disease, unspecified: Secondary | ICD-10-CM | POA: Diagnosis not present

## 2020-07-15 DIAGNOSIS — I129 Hypertensive chronic kidney disease with stage 1 through stage 4 chronic kidney disease, or unspecified chronic kidney disease: Secondary | ICD-10-CM | POA: Diagnosis not present

## 2020-07-15 DIAGNOSIS — Z96612 Presence of left artificial shoulder joint: Secondary | ICD-10-CM | POA: Diagnosis not present

## 2020-07-15 DIAGNOSIS — M1990 Unspecified osteoarthritis, unspecified site: Secondary | ICD-10-CM | POA: Diagnosis not present

## 2020-07-15 DIAGNOSIS — J45909 Unspecified asthma, uncomplicated: Secondary | ICD-10-CM | POA: Diagnosis not present

## 2020-07-18 DIAGNOSIS — Z96612 Presence of left artificial shoulder joint: Secondary | ICD-10-CM | POA: Diagnosis not present

## 2020-07-18 DIAGNOSIS — J449 Chronic obstructive pulmonary disease, unspecified: Secondary | ICD-10-CM | POA: Diagnosis not present

## 2020-07-18 DIAGNOSIS — E785 Hyperlipidemia, unspecified: Secondary | ICD-10-CM | POA: Diagnosis not present

## 2020-07-18 DIAGNOSIS — D631 Anemia in chronic kidney disease: Secondary | ICD-10-CM | POA: Diagnosis not present

## 2020-07-18 DIAGNOSIS — E1122 Type 2 diabetes mellitus with diabetic chronic kidney disease: Secondary | ICD-10-CM | POA: Diagnosis not present

## 2020-07-18 DIAGNOSIS — J45909 Unspecified asthma, uncomplicated: Secondary | ICD-10-CM | POA: Diagnosis not present

## 2020-07-18 DIAGNOSIS — Z471 Aftercare following joint replacement surgery: Secondary | ICD-10-CM | POA: Diagnosis not present

## 2020-07-18 DIAGNOSIS — N189 Chronic kidney disease, unspecified: Secondary | ICD-10-CM | POA: Diagnosis not present

## 2020-07-18 DIAGNOSIS — I129 Hypertensive chronic kidney disease with stage 1 through stage 4 chronic kidney disease, or unspecified chronic kidney disease: Secondary | ICD-10-CM | POA: Diagnosis not present

## 2020-07-18 DIAGNOSIS — M1990 Unspecified osteoarthritis, unspecified site: Secondary | ICD-10-CM | POA: Diagnosis not present

## 2020-07-19 DIAGNOSIS — J309 Allergic rhinitis, unspecified: Secondary | ICD-10-CM | POA: Diagnosis not present

## 2020-07-19 DIAGNOSIS — J45909 Unspecified asthma, uncomplicated: Secondary | ICD-10-CM | POA: Diagnosis not present

## 2020-08-07 DIAGNOSIS — E119 Type 2 diabetes mellitus without complications: Secondary | ICD-10-CM | POA: Diagnosis not present

## 2020-08-07 DIAGNOSIS — R1031 Right lower quadrant pain: Secondary | ICD-10-CM | POA: Diagnosis not present

## 2020-08-15 DIAGNOSIS — R1031 Right lower quadrant pain: Secondary | ICD-10-CM | POA: Diagnosis not present

## 2020-09-06 DIAGNOSIS — E559 Vitamin D deficiency, unspecified: Secondary | ICD-10-CM | POA: Diagnosis not present

## 2020-09-06 DIAGNOSIS — Z79899 Other long term (current) drug therapy: Secondary | ICD-10-CM | POA: Diagnosis not present

## 2020-09-06 DIAGNOSIS — E7800 Pure hypercholesterolemia, unspecified: Secondary | ICD-10-CM | POA: Diagnosis not present

## 2020-09-06 DIAGNOSIS — Z0000 Encounter for general adult medical examination without abnormal findings: Secondary | ICD-10-CM | POA: Diagnosis not present

## 2020-09-06 DIAGNOSIS — E1165 Type 2 diabetes mellitus with hyperglycemia: Secondary | ICD-10-CM | POA: Diagnosis not present

## 2020-09-11 ENCOUNTER — Other Ambulatory Visit: Payer: Self-pay | Admitting: Endocrinology

## 2020-09-11 DIAGNOSIS — Z1231 Encounter for screening mammogram for malignant neoplasm of breast: Secondary | ICD-10-CM

## 2020-10-23 DIAGNOSIS — R0602 Shortness of breath: Secondary | ICD-10-CM | POA: Diagnosis not present

## 2020-10-24 ENCOUNTER — Emergency Department (HOSPITAL_COMMUNITY): Payer: Medicare HMO

## 2020-10-24 ENCOUNTER — Encounter (HOSPITAL_COMMUNITY): Payer: Self-pay

## 2020-10-24 ENCOUNTER — Emergency Department (HOSPITAL_COMMUNITY)
Admission: EM | Admit: 2020-10-24 | Discharge: 2020-10-24 | Disposition: A | Discharge status: Home / Self Care | Payer: Medicare HMO | Attending: Emergency Medicine | Admitting: Emergency Medicine

## 2020-10-24 DIAGNOSIS — Z7982 Long term (current) use of aspirin: Secondary | ICD-10-CM | POA: Insufficient documentation

## 2020-10-24 DIAGNOSIS — R079 Chest pain, unspecified: Secondary | ICD-10-CM | POA: Insufficient documentation

## 2020-10-24 DIAGNOSIS — R000 Tachycardia, unspecified: Secondary | ICD-10-CM | POA: Diagnosis not present

## 2020-10-24 DIAGNOSIS — Z96653 Presence of artificial knee joint, bilateral: Secondary | ICD-10-CM | POA: Diagnosis not present

## 2020-10-24 DIAGNOSIS — R0602 Shortness of breath: Secondary | ICD-10-CM | POA: Insufficient documentation

## 2020-10-24 DIAGNOSIS — R0789 Other chest pain: Secondary | ICD-10-CM | POA: Diagnosis not present

## 2020-10-24 LAB — BASIC METABOLIC PANEL
Anion gap: 12 (ref 5–15)
BUN: 14 mg/dL (ref 8–23)
CO2: 26 mmol/L (ref 22–32)
Calcium: 9.6 mg/dL (ref 8.9–10.3)
Chloride: 100 mmol/L (ref 98–111)
Creatinine, Ser: 0.69 mg/dL (ref 0.44–1.00)
GFR, Estimated: >60 mL/min (ref >60)
Glucose, Bld: 124 mg/dL — ABNORMAL HIGH (ref 70–99)
Potassium: 3.5 mmol/L (ref 3.5–5.1)
Sodium: 138 mmol/L (ref 135–145)

## 2020-10-24 LAB — CBC
HCT: 43.6 % (ref 36.0–46.0)
Hemoglobin: 13.7 g/dL (ref 12.0–15.0)
MCH: 27.7 pg (ref 26.0–34.0)
MCHC: 31.4 g/dL (ref 30.0–36.0)
MCV: 88.3 fL (ref 80.0–100.0)
Platelets: 298 10*3/uL (ref 150–400)
RBC: 4.94 MIL/uL (ref 3.87–5.11)
RDW: 13.9 % (ref 11.5–15.5)
WBC: 10.6 10*3/uL — ABNORMAL HIGH (ref 4.0–10.5)
nRBC: 0 % (ref 0.0–0.2)

## 2020-10-24 LAB — D-DIMER, QUANTITATIVE: D-Dimer, Quant: 1.39 ug{FEU}/mL — ABNORMAL HIGH (ref 0.00–0.50)

## 2020-10-24 LAB — TROPONIN I (HIGH SENSITIVITY): Troponin I (High Sensitivity): 7 ng/L (ref <18)

## 2020-10-24 MED ORDER — TRAMADOL HCL 50 MG PO TABS: 50.0000 mg | ORAL_TABLET | Freq: Four times a day (QID) | ORAL | 0 refills | Status: AC | PRN

## 2020-10-24 MED ORDER — IOHEXOL 350 MG/ML SOLN
100.0000 mL | Freq: Once | INTRAVENOUS | Status: AC
  Administered 2020-10-24: 100 mL via INTRAVENOUS

## 2020-10-24 MED ORDER — TRAMADOL HCL 50 MG PO TABS: 50.0000 mg | ORAL_TABLET | Freq: Four times a day (QID) | ORAL | 0 refills | Status: DC | PRN

## 2020-10-24 NOTE — ED Notes (Signed)
Patient Alert and oriented to baseline. Stable and ambulatory to baseline. Patient verbalized understanding of the discharge instructions.  Patient belongings were taken by the patient.   

## 2020-10-24 NOTE — ED Notes (Signed)
MD aware lab cancelled first Ddimer due to being "cherry red". MD also aware of pt being a difficult stick.

## 2020-10-24 NOTE — ED Triage Notes (Signed)
Pt states that CP and SOB have been going on for a few days, worse tonight

## 2020-10-24 NOTE — ED Provider Notes (Signed)
[facility] EMERGENCY DEPARTMENT Provider Note   CSN: [id number] Arrival date & time: 10/24/20  0305     History Chief Complaint  Patient presents with   Shortness of Breath    Lindsay Fischer is a 70 y.o. female.   Shortness of Breath Associated symptoms: chest pain   Associated symptoms: no abdominal pain and no rash   Patient presents with right-sided chest pain.  Began on Monday.  Today is Thursday.  Worse with.  Some shortness of breath.  Occasional cough.  States she has had 3 negative COVID test.  States she went to see her primary care doctor yesterday who could not see her because she was sick.  No swelling in her legs.  Pain is sharp.  No rash.  No trauma.  States she did previously have blood clot in the lung years ago after surgery.  Not currently on anticoagulation.  Patient states she is worried she could have a bronchitis.    Past Medical History:  Diagnosis Date   Arthritis    shoulders   Gastric ulcer    GERD (gastroesophageal reflux disease)    History of asthma    as a child   History of kidney stones    Obesity    Osteoarthritis    bilateral knee   Right trigger finger 05/2016   ring and small fingers   Wears dentures    upper    Patient Active Problem List   Diagnosis Date Noted   Dysphagia, unspecified(787.20) 04/13/2012   Stricture and stenosis of esophagus 04/13/2012   Esophageal reflux 04/13/2012   Special screening for malignant neoplasms, colon 04/13/2012    Past Surgical History:  Procedure Laterality Date   CARPAL TUNNEL RELEASE Right    CHOLECYSTECTOMY     COLONOSCOPY WITH PROPOFOL  06/09/2012   ESOPHAGOGASTRODUODENOSCOPY (EGD) WITH ESOPHAGEAL DILATION  05/27/2012   with Propofol   KIDNEY STONE SURGERY     LITHOTRIPSY  03/27/2016   LITHOTRIPSY     ORIF FINGER FRACTURE Left    small finger   TOTAL KNEE ARTHROPLASTY Bilateral    TRIGGER FINGER RELEASE Right    middle finger   TRIGGER FINGER RELEASE Right  06/16/2016   Procedure: RELEASE TRIGGER FINGER/A-1 PULLEY RIGHT RING SMALL FINGER;  Surgeon: Cindee Salt, MD;  Location: [facility];  Service: Orthopedics;  Laterality: Right;  IV REG/FAB   TUBAL LIGATION       OB History   No obstetric history on file.     Family History  Adopted: Yes    Social History   Tobacco Use   Smoking status: Never   Smokeless tobacco: Never  Substance Use Topics   Alcohol use: Yes    Comment: rarely   Drug use: No    Home Medications Prior to Admission medications   Medication Sig Start Date End Date Taking? Authorizing Provider  albuterol (VENTOLIN HFA) 108 (90 Base) MCG/ACT inhaler Inhale 2 puffs into the lungs every 4 (four) hours as needed for wheezing or shortness of breath. 03/31/20  Yes Garlon Hatchet, PA-C  aspirin 81 MG EC tablet Take 81 mg by mouth daily. 02/16/19  Yes [provider]  atorvastatin (LIPITOR) 10 MG tablet Take 10 mg by mouth daily. 02/26/20  Yes [provider]  cetirizine (ZYRTEC) 10 MG tablet Take 1 tablet by mouth daily. 01/25/20  Yes [provider]  Cholecalciferol 25 MCG (1000 UT) tablet Take 1,000 Units by mouth daily.  Yes [provider]  Cyanocobalamin (B-12 PO) Take 1 tablet by mouth daily.   Yes [provider]  diphenhydramine-acetaminophen (TYLENOL PM) 25-500 MG TABS tablet Take 1 tablet by mouth at bedtime as needed (headache).   Yes [provider]  ergocalciferol (VITAMIN D2) 1.25 MG (50000 UT) capsule Take 50,000 Units by mouth once a week.   Yes [provider]  famotidine (PEPCID) 40 MG tablet Take 40 mg by mouth daily as needed for heartburn. 07/22/20  Yes [provider]  furosemide (LASIX) 40 MG tablet Take 40 mg by mouth daily as needed for edema. 03/24/20  Yes [provider]  MAGNESIUM CITRATE PO Take 1 tablet by mouth daily.   Yes [provider]  Misc Natural Products (BRAINSTRONG MEMORY SUPPORT) TABS  Take 1 tablet by mouth daily.   Yes [provider]  Omega-3 Fatty Acids (FISH OIL PO) Take 1 tablet by mouth daily.   Yes [provider]  omeprazole (PRILOSEC) 40 MG capsule Take 40 mg by mouth daily as needed (heartburn). 03/02/16  Yes [provider]  OVER THE COUNTER MEDICATION Take 1 tablet by mouth daily.   Yes [provider]  oxybutynin (DITROPAN-XL) 5 MG 24 hr tablet Take 5 mg by mouth daily as needed (bladder).   Yes [provider]  predniSONE (DELTASONE) 20 MG tablet Take 40 mg by mouth daily for 3 days, then 20mg  by mouth daily for 3 days, then 10mg  daily for 3 days Patient taking differently: Take 20 mg by mouth See admin instructions. Take 40 mg by mouth daily for 3 days, then 20mg  by mouth daily for 3 days, then 10mg  daily for 3 days 03/31/20  Yes Garlon HatchetSanders, Lisa M, PA-C  zinc sulfate 220 (50 Zn) MG capsule Take 220 mg by mouth daily. 12/28/19  Yes [provider]  benzonatate (TESSALON) 100 MG capsule Take 1 capsule (100 mg total) by mouth every 8 (eight) hours. Patient not taking: No sig reported 03/31/20   Garlon HatchetSanders, Lisa M, PA-C  traMADol (ULTRAM) 50 MG tablet Take 1 tablet (50 mg total) by mouth every 6 (six) hours as needed. 10/24/20   Benjiman CorePickering, Beverely Suen, MD    Allergies    Propofol, Adhesive [tape], Penicillins, and Codeine  Review of Systems   Review of Systems  Constitutional:  Negative for appetite change.  HENT:  Negative for congestion.   Respiratory:  Positive for shortness of breath.   Cardiovascular:  Positive for chest pain. Negative for leg swelling.  Gastrointestinal:  Negative for abdominal pain.  Genitourinary:  Negative for flank pain.  Musculoskeletal:  Negative for back pain.  Skin:  Negative for rash.  Neurological:  Negative for weakness.  Psychiatric/Behavioral:  Negative for confusion.    Physical Exam Updated Vital Signs BP (!) 123/91 (BP Location: Right Arm)   Pulse 86   Temp 98.5 F (36.9 C)  (Oral)   Resp (!) 23   Ht 4' 10.75" (1.492 m)   Wt 96.6 kg   SpO2 99%   BMI 43.39 kg/m   Physical Exam Vitals and nursing note reviewed.  HENT:     Head: Normocephalic.  Eyes:     Extraocular Movements: Extraocular movements intact.  Cardiovascular:     Rate and Rhythm: Regular rhythm. Tachycardia present.  Pulmonary:     Breath sounds: Normal breath sounds. No wheezing, rhonchi or rales.  Chest:     Chest wall: Tenderness present.  Abdominal:     Tenderness: There is  no abdominal tenderness.  Musculoskeletal:     Cervical back: Normal range of motion.     Right lower leg: No edema.     Left lower leg: No edema.  Skin:    General: Skin is warm.     Capillary Refill: Capillary refill takes less than 2 seconds.  Neurological:     Mental Status: She is alert and oriented to person, place, and time.    ED Results / Procedures / Treatments   Labs (all labs ordered are listed, but only abnormal results are displayed) Labs Reviewed  BASIC METABOLIC PANEL - Abnormal; Notable for the following components:      Result Value   Glucose, Bld 124 (*)    All other components within normal limits  CBC - Abnormal; Notable for the following components:   WBC 10.6 (*)    All other components within normal limits  D-DIMER, QUANTITATIVE - Abnormal; Notable for the following components:   D-Dimer, Quant 1.39 (*)    All other components within normal limits  TROPONIN I (HIGH SENSITIVITY)    EKG EKG Interpretation  Date/Time:  Thursday October 24 2020 03:10:00 EDT Ventricular Rate:  123 PR Interval:  136 QRS Duration: 72 QT Interval:  340 QTC Calculation: 486 R Axis:   70 Text Interpretation: Sinus tachycardia Cannot rule out Anterior infarct , age undetermined Abnormal ECG Confirmed by Benjiman Core [phone]) on 10/24/2020 6:59:56 AM  Radiology DG Chest 2 View  Result Date: 10/24/2020 CLINICAL DATA:  Chest pain EXAM: CHEST - 2 VIEW COMPARISON:  03/30/2020 FINDINGS: Lungs are  clear. No pneumothorax or pleural effusion. Cardiac size within normal limits. Pulmonary vascularity is normal. Osseous structures are age-appropriate. Bilateral total shoulder arthroplasty has been performed. No acute bone abnormality. IMPRESSION: No active cardiopulmonary disease. Electronically Signed   By: Helyn Numbers MD   On: 10/24/2020 04:22   CT Angio Chest PE W and/or Wo Contrast  Result Date: 10/24/2020 CLINICAL DATA:  PE.  Right-sided chest pain.  Shortness of breath. EXAM: CT ANGIOGRAPHY CHEST WITH CONTRAST TECHNIQUE: Multidetector CT imaging of the chest was performed using the standard protocol during bolus administration of intravenous contrast. Multiplanar CT image reconstructions and MIPs were obtained to evaluate the vascular anatomy. CONTRAST:  OMNIPAQUE IOHEXOL 350 MG/ML SOLN COMPARISON:  03/31/2020 FINDINGS: Cardiovascular: Normal heart size. No pericardial effusion. Aortic atherosclerosis. Coronary artery calcifications noted. Despite two attempts pulmonary artery opacification is suboptimal. Additionally, there is respiratory motion artifact affecting the exam detail at the level of the lower lobes. Within this limitation the main pulmonary artery appears patent. There are no signs of lobar or segmental pulmonary artery embolus. Mediastinum/Nodes: Normal appearance of the thyroid gland. The trachea appears patent and is midline. Normal appearance of the esophagus. No enlarged lymph nodes. Lungs/Pleura: No pleural effusion, airspace consolidation or atelectasis. No pneumothorax identified. Upper Abdomen: No acute abnormality. Musculoskeletal: No chest wall abnormality. No acute or significant osseous findings. Review of the MIP images confirms the above findings. IMPRESSION: 1. Exam detail is diminished secondary to suboptimal pulmonary artery opacification, respiratory motion artifact. Within this limitation there are no signs to suggest a clinically significant acute pulmonary  embolus. 2. Coronary artery calcifications noted. 3. Aortic atherosclerosis. Aortic Atherosclerosis (ICD10-I70.0). Electronically Signed   By: Signa Kell M.D.   On: 10/24/2020 13:11    Procedures Procedures   Medications Ordered in ED Medications  iohexol (OMNIPAQUE) 350 MG/ML injection 100 mL (100 mLs Intravenous Contrast Given 10/24/20 1256)  ED Course  I have reviewed the triage vital signs and the nursing notes.  Pertinent labs & imaging results that were available during my care of the patient were reviewed by me and considered in my medical decision making (see chart for details).    MDM Rules/Calculators/A&P                          Patient with chest pain.  Right lateral chest.  Worse with breathing.  Has had previous DVT.  D-dimer done mildly elevated.  CT scan done and reassuring.  No clot seen.  EKG reassuring.  Doubt cardiac cause.  No pneumonia seen.  No pneumothorax.  Patient appears stable for discharge home with outpatient follow-up.  Pain medicine given. Final Clinical Impression(s) / ED Diagnoses Final diagnoses:  Nonspecific chest pain    Rx / DC Orders ED Discharge Orders          Ordered    traMADol (ULTRAM) 50 MG tablet  Every 6 hours PRN,   Status:  Discontinued        10/24/20 1409    traMADol (ULTRAM) 50 MG tablet  Every 6 hours PRN        10/24/20 1411             Benjiman Core, MD 10/24/20 1412

## 2020-10-25 DIAGNOSIS — R0689 Other abnormalities of breathing: Secondary | ICD-10-CM | POA: Diagnosis not present

## 2020-10-25 DIAGNOSIS — R0602 Shortness of breath: Secondary | ICD-10-CM | POA: Diagnosis not present

## 2020-10-25 DIAGNOSIS — M62838 Other muscle spasm: Secondary | ICD-10-CM | POA: Diagnosis not present

## 2020-10-25 DIAGNOSIS — J40 Bronchitis, not specified as acute or chronic: Secondary | ICD-10-CM | POA: Diagnosis not present

## 2020-10-25 DIAGNOSIS — Z79899 Other long term (current) drug therapy: Secondary | ICD-10-CM | POA: Diagnosis not present

## 2020-11-19 DIAGNOSIS — J45909 Unspecified asthma, uncomplicated: Secondary | ICD-10-CM | POA: Diagnosis not present

## 2020-11-19 DIAGNOSIS — J309 Allergic rhinitis, unspecified: Secondary | ICD-10-CM | POA: Diagnosis not present

## 2020-11-25 ENCOUNTER — Inpatient Hospital Stay: Admission: RE | Admit: 2020-11-25 | Payer: Medicare Other | Source: Ambulatory Visit

## 2021-01-07 DIAGNOSIS — N200 Calculus of kidney: Secondary | ICD-10-CM | POA: Diagnosis not present

## 2021-01-07 DIAGNOSIS — R310 Gross hematuria: Secondary | ICD-10-CM | POA: Diagnosis not present

## 2021-01-14 DIAGNOSIS — N200 Calculus of kidney: Secondary | ICD-10-CM | POA: Diagnosis not present

## 2021-01-14 DIAGNOSIS — R7309 Other abnormal glucose: Secondary | ICD-10-CM | POA: Diagnosis not present

## 2021-01-14 DIAGNOSIS — N201 Calculus of ureter: Secondary | ICD-10-CM | POA: Diagnosis not present

## 2021-02-13 DIAGNOSIS — R3129 Other microscopic hematuria: Secondary | ICD-10-CM | POA: Diagnosis not present

## 2021-02-13 DIAGNOSIS — N200 Calculus of kidney: Secondary | ICD-10-CM | POA: Diagnosis not present

## 2021-02-13 DIAGNOSIS — R109 Unspecified abdominal pain: Secondary | ICD-10-CM | POA: Diagnosis not present

## 2021-02-13 DIAGNOSIS — Z9889 Other specified postprocedural states: Secondary | ICD-10-CM | POA: Diagnosis not present

## 2021-02-13 DIAGNOSIS — Z48816 Encounter for surgical aftercare following surgery on the genitourinary system: Secondary | ICD-10-CM | POA: Diagnosis not present

## 2021-02-13 DIAGNOSIS — R82998 Other abnormal findings in urine: Secondary | ICD-10-CM | POA: Diagnosis not present

## 2021-03-19 DIAGNOSIS — R3129 Other microscopic hematuria: Secondary | ICD-10-CM | POA: Diagnosis not present

## 2021-03-19 DIAGNOSIS — N200 Calculus of kidney: Secondary | ICD-10-CM | POA: Diagnosis not present

## 2021-03-27 DIAGNOSIS — Z96612 Presence of left artificial shoulder joint: Secondary | ICD-10-CM | POA: Diagnosis not present

## 2021-03-27 DIAGNOSIS — Z96611 Presence of right artificial shoulder joint: Secondary | ICD-10-CM | POA: Diagnosis not present

## 2021-04-02 DIAGNOSIS — N132 Hydronephrosis with renal and ureteral calculous obstruction: Secondary | ICD-10-CM | POA: Diagnosis not present

## 2021-04-07 DIAGNOSIS — N200 Calculus of kidney: Secondary | ICD-10-CM | POA: Diagnosis not present

## 2021-04-16 DIAGNOSIS — Z888 Allergy status to other drugs, medicaments and biological substances status: Secondary | ICD-10-CM | POA: Diagnosis not present

## 2021-04-16 DIAGNOSIS — Z880 Allergy status to penicillin: Secondary | ICD-10-CM | POA: Diagnosis not present

## 2021-04-16 DIAGNOSIS — Z91048 Other nonmedicinal substance allergy status: Secondary | ICD-10-CM | POA: Diagnosis not present

## 2021-04-16 DIAGNOSIS — N189 Chronic kidney disease, unspecified: Secondary | ICD-10-CM | POA: Diagnosis not present

## 2021-04-16 DIAGNOSIS — K219 Gastro-esophageal reflux disease without esophagitis: Secondary | ICD-10-CM | POA: Diagnosis not present

## 2021-04-16 DIAGNOSIS — N132 Hydronephrosis with renal and ureteral calculous obstruction: Secondary | ICD-10-CM | POA: Diagnosis not present

## 2021-04-16 DIAGNOSIS — Z885 Allergy status to narcotic agent status: Secondary | ICD-10-CM | POA: Diagnosis not present

## 2021-04-16 DIAGNOSIS — Z6841 Body Mass Index (BMI) 40.0 and over, adult: Secondary | ICD-10-CM | POA: Diagnosis not present

## 2021-04-16 DIAGNOSIS — Z87442 Personal history of urinary calculi: Secondary | ICD-10-CM | POA: Diagnosis not present

## 2021-04-16 DIAGNOSIS — M1990 Unspecified osteoarthritis, unspecified site: Secondary | ICD-10-CM | POA: Diagnosis not present

## 2021-04-16 DIAGNOSIS — N200 Calculus of kidney: Secondary | ICD-10-CM | POA: Diagnosis not present

## 2021-04-16 DIAGNOSIS — Z79899 Other long term (current) drug therapy: Secondary | ICD-10-CM | POA: Diagnosis not present

## 2021-04-17 DIAGNOSIS — J45909 Unspecified asthma, uncomplicated: Secondary | ICD-10-CM | POA: Diagnosis not present

## 2021-04-17 DIAGNOSIS — G4733 Obstructive sleep apnea (adult) (pediatric): Secondary | ICD-10-CM | POA: Diagnosis not present

## 2021-04-17 DIAGNOSIS — N200 Calculus of kidney: Secondary | ICD-10-CM | POA: Diagnosis not present

## 2021-04-17 DIAGNOSIS — E119 Type 2 diabetes mellitus without complications: Secondary | ICD-10-CM | POA: Diagnosis not present

## 2021-04-17 DIAGNOSIS — Z79899 Other long term (current) drug therapy: Secondary | ICD-10-CM | POA: Diagnosis not present

## 2021-04-17 DIAGNOSIS — K219 Gastro-esophageal reflux disease without esophagitis: Secondary | ICD-10-CM | POA: Diagnosis not present

## 2021-04-18 DIAGNOSIS — E119 Type 2 diabetes mellitus without complications: Secondary | ICD-10-CM | POA: Diagnosis not present

## 2021-04-18 DIAGNOSIS — J45909 Unspecified asthma, uncomplicated: Secondary | ICD-10-CM | POA: Diagnosis not present

## 2021-04-18 DIAGNOSIS — K219 Gastro-esophageal reflux disease without esophagitis: Secondary | ICD-10-CM | POA: Diagnosis not present

## 2021-04-18 DIAGNOSIS — G4733 Obstructive sleep apnea (adult) (pediatric): Secondary | ICD-10-CM | POA: Diagnosis not present

## 2021-04-18 DIAGNOSIS — Z79899 Other long term (current) drug therapy: Secondary | ICD-10-CM | POA: Diagnosis not present

## 2021-04-18 DIAGNOSIS — N200 Calculus of kidney: Secondary | ICD-10-CM | POA: Diagnosis not present

## 2021-05-02 DIAGNOSIS — E119 Type 2 diabetes mellitus without complications: Secondary | ICD-10-CM | POA: Diagnosis not present

## 2021-05-02 DIAGNOSIS — I10 Essential (primary) hypertension: Secondary | ICD-10-CM | POA: Diagnosis not present

## 2021-05-02 DIAGNOSIS — R32 Unspecified urinary incontinence: Secondary | ICD-10-CM | POA: Diagnosis not present

## 2021-05-02 DIAGNOSIS — Z880 Allergy status to penicillin: Secondary | ICD-10-CM | POA: Diagnosis not present

## 2021-05-02 DIAGNOSIS — Z008 Encounter for other general examination: Secondary | ICD-10-CM | POA: Diagnosis not present

## 2021-05-02 DIAGNOSIS — Z7722 Contact with and (suspected) exposure to environmental tobacco smoke (acute) (chronic): Secondary | ICD-10-CM | POA: Diagnosis not present

## 2021-05-02 DIAGNOSIS — Z6841 Body Mass Index (BMI) 40.0 and over, adult: Secondary | ICD-10-CM | POA: Diagnosis not present

## 2021-05-02 DIAGNOSIS — G8929 Other chronic pain: Secondary | ICD-10-CM | POA: Diagnosis not present

## 2021-05-02 DIAGNOSIS — K219 Gastro-esophageal reflux disease without esophagitis: Secondary | ICD-10-CM | POA: Diagnosis not present

## 2021-05-02 DIAGNOSIS — E785 Hyperlipidemia, unspecified: Secondary | ICD-10-CM | POA: Diagnosis not present

## 2021-05-02 DIAGNOSIS — J449 Chronic obstructive pulmonary disease, unspecified: Secondary | ICD-10-CM | POA: Diagnosis not present

## 2021-05-02 DIAGNOSIS — J302 Other seasonal allergic rhinitis: Secondary | ICD-10-CM | POA: Diagnosis not present

## 2021-05-02 DIAGNOSIS — M1990 Unspecified osteoarthritis, unspecified site: Secondary | ICD-10-CM | POA: Diagnosis not present

## 2021-05-04 DIAGNOSIS — J069 Acute upper respiratory infection, unspecified: Secondary | ICD-10-CM | POA: Diagnosis not present

## 2021-05-04 DIAGNOSIS — R051 Acute cough: Secondary | ICD-10-CM | POA: Diagnosis not present

## 2021-05-04 DIAGNOSIS — J45909 Unspecified asthma, uncomplicated: Secondary | ICD-10-CM | POA: Diagnosis not present

## 2021-07-17 DIAGNOSIS — Z87442 Personal history of urinary calculi: Secondary | ICD-10-CM | POA: Diagnosis not present

## 2021-07-17 DIAGNOSIS — Z48816 Encounter for surgical aftercare following surgery on the genitourinary system: Secondary | ICD-10-CM | POA: Diagnosis not present

## 2021-07-17 DIAGNOSIS — N3941 Urge incontinence: Secondary | ICD-10-CM | POA: Diagnosis not present

## 2021-07-17 DIAGNOSIS — N3281 Overactive bladder: Secondary | ICD-10-CM | POA: Diagnosis not present

## 2021-07-17 DIAGNOSIS — Z9889 Other specified postprocedural states: Secondary | ICD-10-CM | POA: Diagnosis not present

## 2021-07-17 DIAGNOSIS — N200 Calculus of kidney: Secondary | ICD-10-CM | POA: Diagnosis not present

## 2021-08-06 DIAGNOSIS — N200 Calculus of kidney: Secondary | ICD-10-CM | POA: Diagnosis not present

## 2021-08-07 DIAGNOSIS — N200 Calculus of kidney: Secondary | ICD-10-CM | POA: Diagnosis not present

## 2021-08-14 DIAGNOSIS — M722 Plantar fascial fibromatosis: Secondary | ICD-10-CM | POA: Diagnosis not present

## 2021-09-06 IMAGING — CT CT ANGIO CHEST
4 of 7 series · 18 of 46 positions shown · IV contrast (APPLIED)
Comparison: 03/31/2020

CLINICAL DATA: PE.  Right-sided chest pain.  Shortness of breath.

EXAM:
CT ANGIOGRAPHY CHEST WITH CONTRAST
TECHNIQUE: Multidetector CT imaging of the chest was performed using the
standard protocol during bolus administration of intravenous
contrast. Multiplanar CT image reconstructions and MIPs were
obtained to evaluate the vascular anatomy.
CONTRAST:  100mL OMNIPAQUE IOHEXOL 350 MG/ML SOLN

[Series 6: pe 3.0 i30f 3 · axial · 0.67mm/px · z∈[+956,+1112]mm · 5 of 80 slices shown (1 of 2)]
[im 14/80  lung]
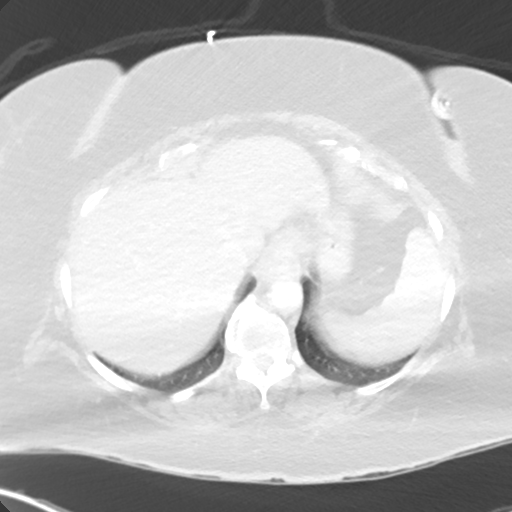
[im 27/80  soft-tissue]
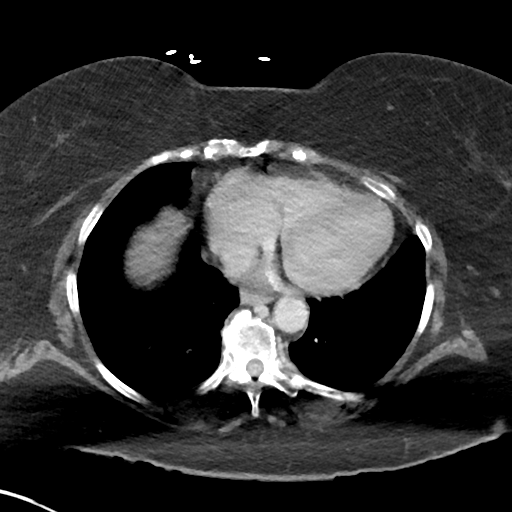
[im 40/80  lung]
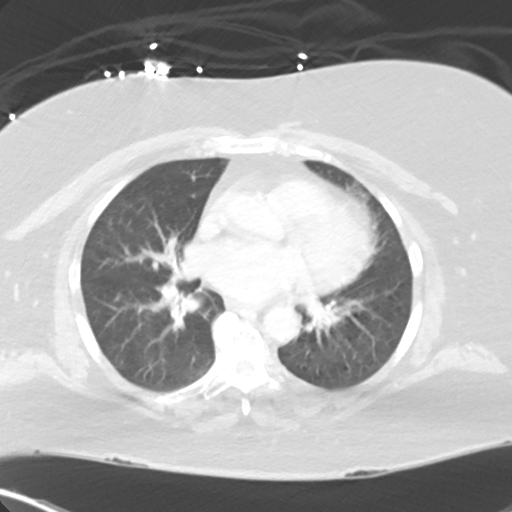
[im 53/80  soft-tissue]
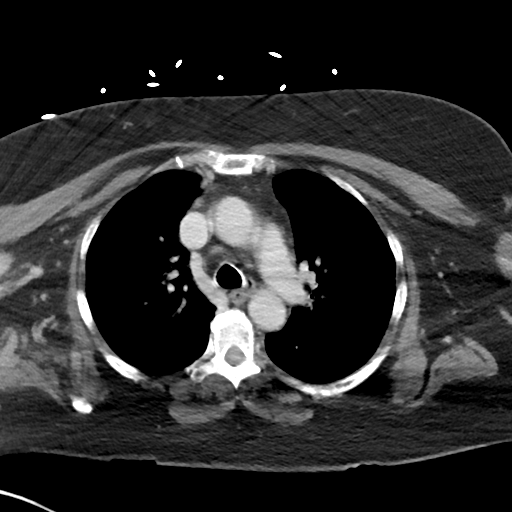
[im 66/80  lung]
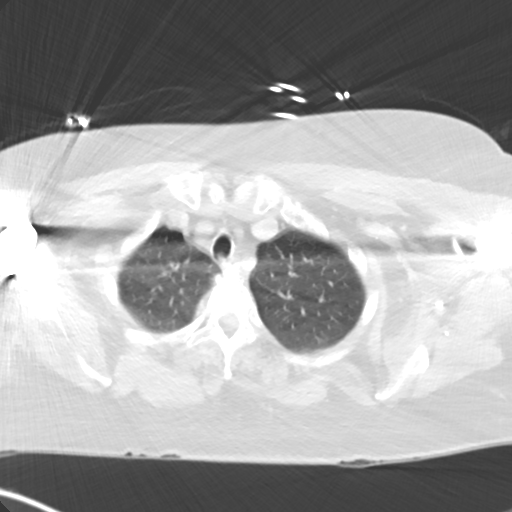

[Series 11: pe 3.0 i30f 3 · axial · 0.67mm/px · z∈[+981,+1023]mm · 2 of 72 slices shown (2 of 2)]
[im 15/72  lung]
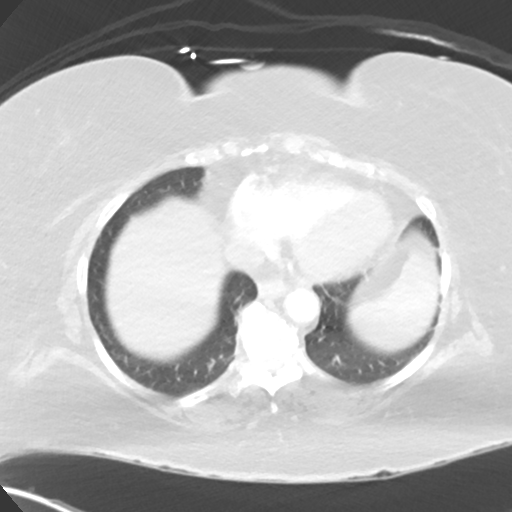
[im 29/72  lung]
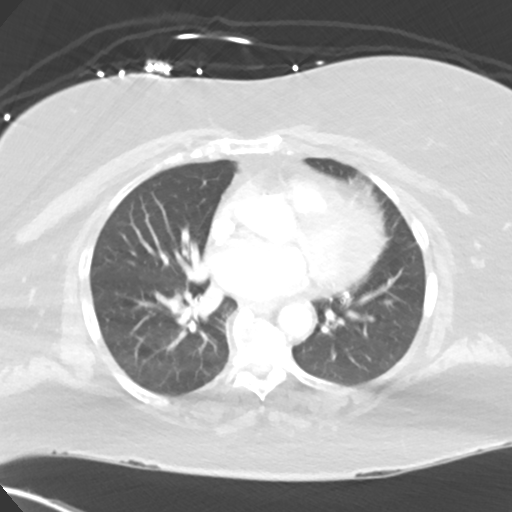

[Series 12: thins · axial · 0.67mm/px · z∈[+961,+1128]mm · 8 of 215 slices shown]
[im 24/215  lung]
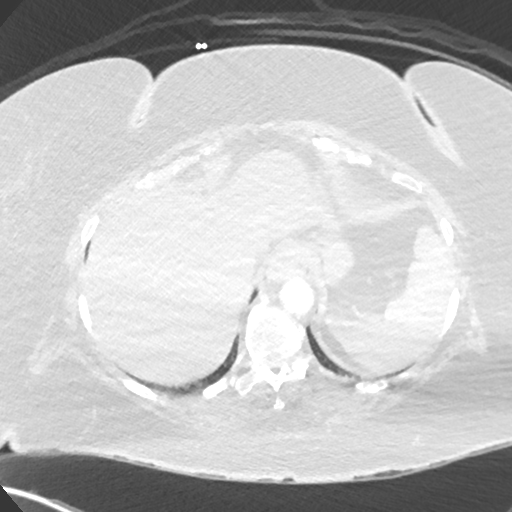
[im 48/215  lung]
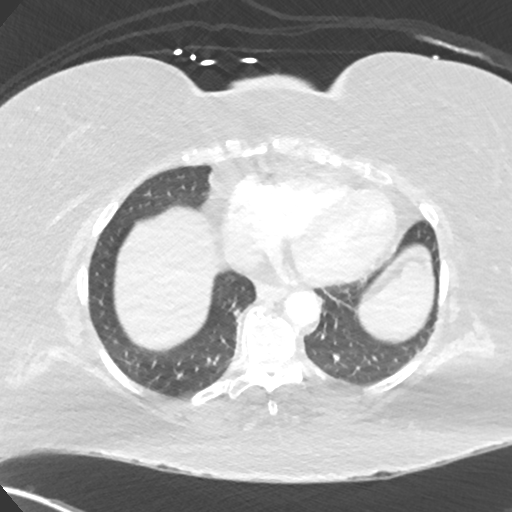
[im 72/215  lung]
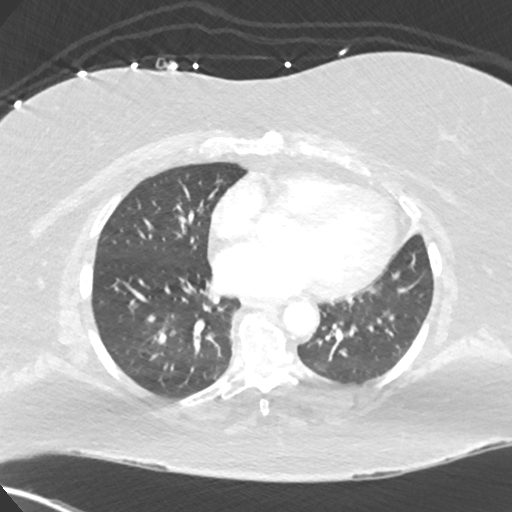
[im 96/215  lung]
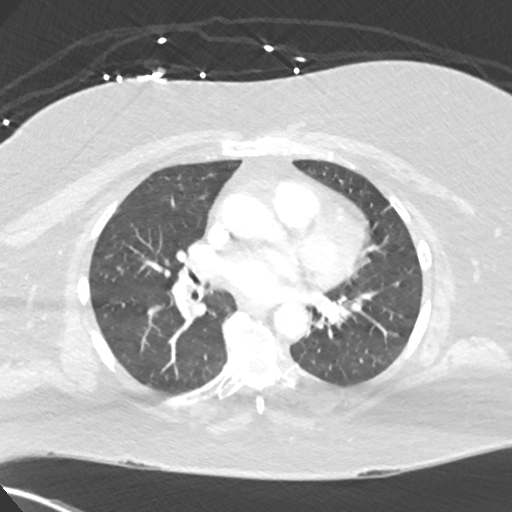
[im 119/215  lung]
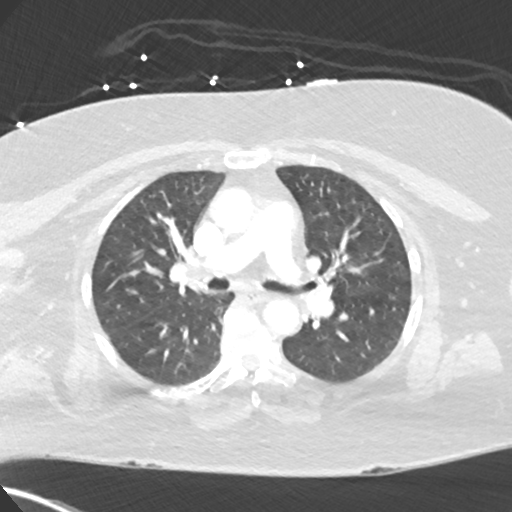
[im 143/215  lung]
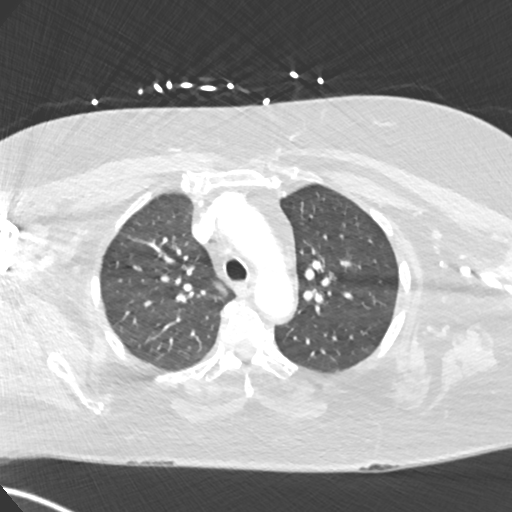
[im 167/215  lung]
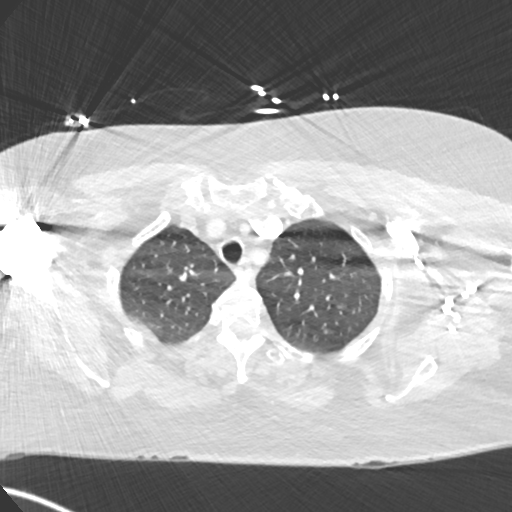
[im 191/215  lung]
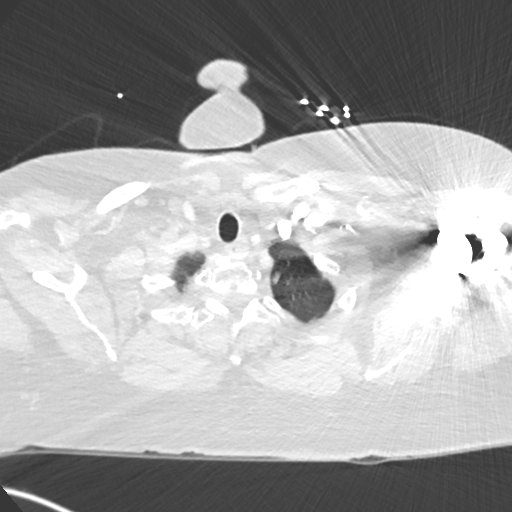

[Series 14: coronal mpr · coronal · 0.43mm/px · 3 of 151 slices shown]
[im 38/151  soft-tissue]
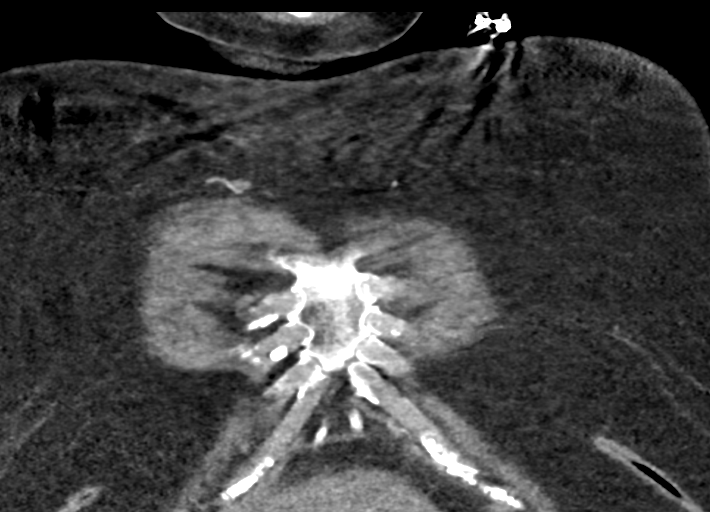
[im 76/151  soft-tissue]
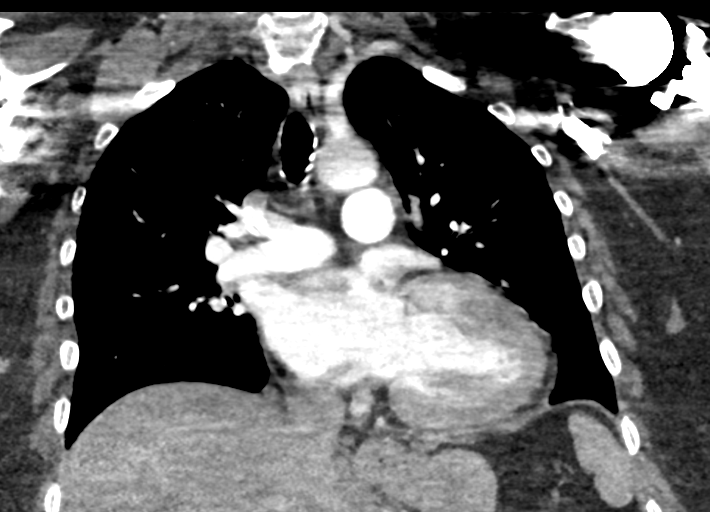
[im 113/151  soft-tissue]
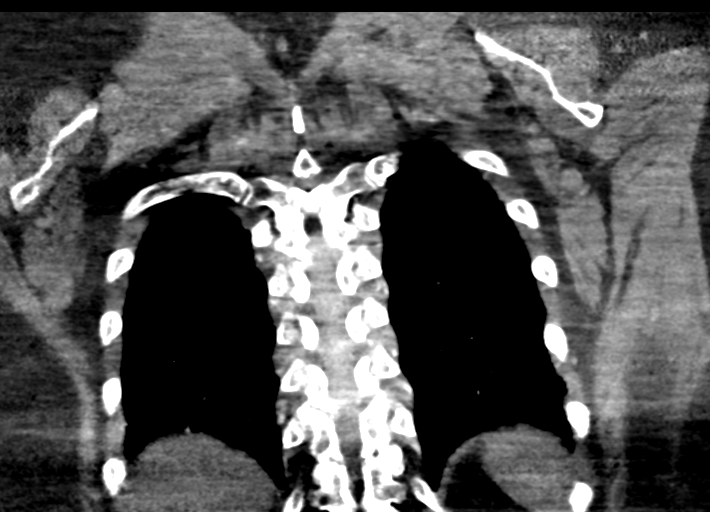

[18 of 46 positions shown; findings below may reference images not displayed]

FINDINGS: Cardiovascular: Normal heart size. No pericardial effusion. Aortic
atherosclerosis. Coronary artery calcifications noted.

Despite two attempts pulmonary artery opacification is suboptimal.
Additionally, there is respiratory motion artifact affecting the
exam detail at the level of the lower lobes. Within this limitation
the main pulmonary artery appears patent. There are no signs of
lobar or segmental pulmonary artery embolus.

Mediastinum/Nodes: Normal appearance of the thyroid gland. The
trachea appears patent and is midline. Normal appearance of the
esophagus. No enlarged lymph nodes.

Lungs/Pleura: No pleural effusion, airspace consolidation or
atelectasis. No pneumothorax identified.

Upper Abdomen: No acute abnormality.

Musculoskeletal: No chest wall abnormality. No acute or significant
osseous findings.

Review of the MIP images confirms the above findings.
IMPRESSION: 1. Exam detail is diminished secondary to suboptimal pulmonary
artery opacification, respiratory motion artifact. Within this
limitation there are no signs to suggest a clinically significant
acute pulmonary embolus.
2. Coronary artery calcifications noted.
3. Aortic atherosclerosis.

Aortic Atherosclerosis ([id number]).

## 2021-10-02 DIAGNOSIS — H5213 Myopia, bilateral: Secondary | ICD-10-CM | POA: Diagnosis not present

## 2021-10-02 DIAGNOSIS — H52209 Unspecified astigmatism, unspecified eye: Secondary | ICD-10-CM | POA: Diagnosis not present

## 2021-10-02 DIAGNOSIS — H2511 Age-related nuclear cataract, right eye: Secondary | ICD-10-CM | POA: Diagnosis not present

## 2021-10-02 DIAGNOSIS — H2512 Age-related nuclear cataract, left eye: Secondary | ICD-10-CM | POA: Diagnosis not present

## 2021-10-02 DIAGNOSIS — H524 Presbyopia: Secondary | ICD-10-CM | POA: Diagnosis not present

## 2021-12-03 DIAGNOSIS — E559 Vitamin D deficiency, unspecified: Secondary | ICD-10-CM | POA: Diagnosis not present

## 2021-12-03 DIAGNOSIS — E7800 Pure hypercholesterolemia, unspecified: Secondary | ICD-10-CM | POA: Diagnosis not present

## 2021-12-03 DIAGNOSIS — Z1329 Encounter for screening for other suspected endocrine disorder: Secondary | ICD-10-CM | POA: Diagnosis not present

## 2021-12-03 DIAGNOSIS — R413 Other amnesia: Secondary | ICD-10-CM | POA: Diagnosis not present

## 2021-12-03 DIAGNOSIS — Z1159 Encounter for screening for other viral diseases: Secondary | ICD-10-CM | POA: Diagnosis not present

## 2021-12-03 DIAGNOSIS — Z20822 Contact with and (suspected) exposure to covid-19: Secondary | ICD-10-CM | POA: Diagnosis not present

## 2021-12-03 DIAGNOSIS — Z131 Encounter for screening for diabetes mellitus: Secondary | ICD-10-CM | POA: Diagnosis not present

## 2021-12-03 DIAGNOSIS — Z1211 Encounter for screening for malignant neoplasm of colon: Secondary | ICD-10-CM | POA: Diagnosis not present

## 2021-12-03 DIAGNOSIS — Z0000 Encounter for general adult medical examination without abnormal findings: Secondary | ICD-10-CM | POA: Diagnosis not present

## 2021-12-03 DIAGNOSIS — Z113 Encounter for screening for infections with a predominantly sexual mode of transmission: Secondary | ICD-10-CM | POA: Diagnosis not present

## 2021-12-03 DIAGNOSIS — R030 Elevated blood-pressure reading, without diagnosis of hypertension: Secondary | ICD-10-CM | POA: Diagnosis not present

## 2021-12-03 DIAGNOSIS — Z1339 Encounter for screening examination for other mental health and behavioral disorders: Secondary | ICD-10-CM | POA: Diagnosis not present

## 2021-12-03 DIAGNOSIS — Z79899 Other long term (current) drug therapy: Secondary | ICD-10-CM | POA: Diagnosis not present

## 2021-12-03 DIAGNOSIS — R0602 Shortness of breath: Secondary | ICD-10-CM | POA: Diagnosis not present

## 2021-12-03 DIAGNOSIS — Z711 Person with feared health complaint in whom no diagnosis is made: Secondary | ICD-10-CM | POA: Diagnosis not present

## 2021-12-03 DIAGNOSIS — Z6841 Body Mass Index (BMI) 40.0 and over, adult: Secondary | ICD-10-CM | POA: Diagnosis not present

## 2021-12-03 DIAGNOSIS — R69 Illness, unspecified: Secondary | ICD-10-CM | POA: Diagnosis not present

## 2021-12-10 DIAGNOSIS — R0602 Shortness of breath: Secondary | ICD-10-CM | POA: Diagnosis not present

## 2021-12-10 DIAGNOSIS — N200 Calculus of kidney: Secondary | ICD-10-CM | POA: Diagnosis not present

## 2021-12-10 DIAGNOSIS — Z6841 Body Mass Index (BMI) 40.0 and over, adult: Secondary | ICD-10-CM | POA: Diagnosis not present

## 2021-12-10 DIAGNOSIS — G4730 Sleep apnea, unspecified: Secondary | ICD-10-CM | POA: Diagnosis not present

## 2021-12-10 DIAGNOSIS — H9110 Presbycusis, unspecified ear: Secondary | ICD-10-CM | POA: Diagnosis not present

## 2021-12-10 DIAGNOSIS — J4520 Mild intermittent asthma, uncomplicated: Secondary | ICD-10-CM | POA: Diagnosis not present

## 2021-12-10 DIAGNOSIS — E538 Deficiency of other specified B group vitamins: Secondary | ICD-10-CM | POA: Diagnosis not present

## 2021-12-10 DIAGNOSIS — K2950 Unspecified chronic gastritis without bleeding: Secondary | ICD-10-CM | POA: Diagnosis not present

## 2021-12-10 DIAGNOSIS — I2510 Atherosclerotic heart disease of native coronary artery without angina pectoris: Secondary | ICD-10-CM | POA: Diagnosis not present

## 2021-12-10 DIAGNOSIS — R635 Abnormal weight gain: Secondary | ICD-10-CM | POA: Diagnosis not present

## 2021-12-10 DIAGNOSIS — I700 Atherosclerosis of aorta: Secondary | ICD-10-CM | POA: Diagnosis not present

## 2021-12-17 DIAGNOSIS — T8484XA Pain due to internal orthopedic prosthetic devices, implants and grafts, initial encounter: Secondary | ICD-10-CM | POA: Diagnosis not present

## 2021-12-17 DIAGNOSIS — Z96652 Presence of left artificial knee joint: Secondary | ICD-10-CM | POA: Diagnosis not present

## 2021-12-19 DIAGNOSIS — N3281 Overactive bladder: Secondary | ICD-10-CM | POA: Diagnosis not present

## 2021-12-19 DIAGNOSIS — Z8542 Personal history of malignant neoplasm of other parts of uterus: Secondary | ICD-10-CM | POA: Diagnosis not present

## 2021-12-19 DIAGNOSIS — J449 Chronic obstructive pulmonary disease, unspecified: Secondary | ICD-10-CM | POA: Diagnosis not present

## 2021-12-19 DIAGNOSIS — Z791 Long term (current) use of non-steroidal anti-inflammatories (NSAID): Secondary | ICD-10-CM | POA: Diagnosis not present

## 2021-12-19 DIAGNOSIS — E119 Type 2 diabetes mellitus without complications: Secondary | ICD-10-CM | POA: Diagnosis not present

## 2021-12-19 DIAGNOSIS — G4733 Obstructive sleep apnea (adult) (pediatric): Secondary | ICD-10-CM | POA: Diagnosis not present

## 2021-12-19 DIAGNOSIS — R030 Elevated blood-pressure reading, without diagnosis of hypertension: Secondary | ICD-10-CM | POA: Diagnosis not present

## 2021-12-19 DIAGNOSIS — Z008 Encounter for other general examination: Secondary | ICD-10-CM | POA: Diagnosis not present

## 2021-12-19 DIAGNOSIS — E785 Hyperlipidemia, unspecified: Secondary | ICD-10-CM | POA: Diagnosis not present

## 2021-12-19 DIAGNOSIS — N3941 Urge incontinence: Secondary | ICD-10-CM | POA: Diagnosis not present

## 2021-12-19 DIAGNOSIS — E559 Vitamin D deficiency, unspecified: Secondary | ICD-10-CM | POA: Diagnosis not present

## 2021-12-19 DIAGNOSIS — M1990 Unspecified osteoarthritis, unspecified site: Secondary | ICD-10-CM | POA: Diagnosis not present

## 2022-01-07 DIAGNOSIS — Z1231 Encounter for screening mammogram for malignant neoplasm of breast: Secondary | ICD-10-CM | POA: Diagnosis not present

## 2022-01-13 DIAGNOSIS — G8929 Other chronic pain: Secondary | ICD-10-CM | POA: Diagnosis not present

## 2022-01-13 DIAGNOSIS — M5440 Lumbago with sciatica, unspecified side: Secondary | ICD-10-CM | POA: Diagnosis not present

## 2022-01-21 DIAGNOSIS — R030 Elevated blood-pressure reading, without diagnosis of hypertension: Secondary | ICD-10-CM | POA: Diagnosis not present

## 2022-01-21 DIAGNOSIS — J069 Acute upper respiratory infection, unspecified: Secondary | ICD-10-CM | POA: Diagnosis not present

## 2022-01-21 DIAGNOSIS — Z6841 Body Mass Index (BMI) 40.0 and over, adult: Secondary | ICD-10-CM | POA: Diagnosis not present

## 2022-01-21 DIAGNOSIS — R7303 Prediabetes: Secondary | ICD-10-CM | POA: Diagnosis not present

## 2022-01-21 DIAGNOSIS — L299 Pruritus, unspecified: Secondary | ICD-10-CM | POA: Diagnosis not present

## 2022-01-30 DIAGNOSIS — J069 Acute upper respiratory infection, unspecified: Secondary | ICD-10-CM | POA: Diagnosis not present

## 2022-01-30 DIAGNOSIS — B9689 Other specified bacterial agents as the cause of diseases classified elsewhere: Secondary | ICD-10-CM | POA: Diagnosis not present

## 2022-01-31 ENCOUNTER — Emergency Department (HOSPITAL_COMMUNITY)
Admission: EM | Admit: 2022-01-31 | Discharge: 2022-01-31 | Disposition: A | Discharge status: Home / Self Care | Payer: Medicare HMO | Attending: Emergency Medicine | Admitting: Emergency Medicine

## 2022-01-31 ENCOUNTER — Other Ambulatory Visit: Payer: Self-pay

## 2022-01-31 DIAGNOSIS — H1030 Unspecified acute conjunctivitis, unspecified eye: Secondary | ICD-10-CM | POA: Diagnosis not present

## 2022-01-31 DIAGNOSIS — Z7982 Long term (current) use of aspirin: Secondary | ICD-10-CM | POA: Diagnosis not present

## 2022-01-31 DIAGNOSIS — B309 Viral conjunctivitis, unspecified: Secondary | ICD-10-CM | POA: Insufficient documentation

## 2022-01-31 DIAGNOSIS — H5789 Other specified disorders of eye and adnexa: Secondary | ICD-10-CM | POA: Diagnosis not present

## 2022-01-31 NOTE — ED Triage Notes (Signed)
Ambulatory to ED with c/o bilateral eye pain. States it started [place] she was [place] the fle[place]. States her vision is fine, just burning.

## 2022-01-31 NOTE — ED Provider Notes (Signed)
[facility] Note   CSN: [id number] Arrival date & time: 01/31/22  1953     History  Chief Complaint  Patient presents with   Eye Pain    Lindsay Fischer is a 71 y.o. female presenting with bilateral eye burning.  She says it is gotten better since prior to arrival.  Reports she [place] the fle[place] market and started to experience pain and burning in her eyes.  Does also report that she currently is being treated for a viral URI and cough.  No blurred vision, diplopia or visual disturbance.  Does not wear contact lenses.   Eye Pain       Home Medications Prior to Admission medications   Medication Sig Start Date End Date Taking? Authorizing Provider  albuterol (VENTOLIN HFA) 108 (90 Base) MCG/ACT inhaler Inhale 2 puffs into the lungs every 4 (four) hours as needed for wheezing or shortness of breath. 03/31/20   Garlon Hatchet, PA-C  aspirin 81 MG EC tablet Take 81 mg by mouth daily. 02/16/19   [provider]  atorvastatin (LIPITOR) 10 MG tablet Take 10 mg by mouth daily. 02/26/20   [provider]  benzonatate (TESSALON) 100 MG capsule Take 1 capsule (100 mg total) by mouth every 8 (eight) hours. Patient not taking: No sig reported 03/31/20   Garlon Hatchet, PA-C  cetirizine (ZYRTEC) 10 MG tablet Take 1 tablet by mouth daily. 01/25/20   [provider]  Cholecalciferol 25 MCG (1000 UT) tablet Take 1,000 Units by mouth daily.    [provider]  Cyanocobalamin (B-12 PO) Take 1 tablet by mouth daily.    [provider]  diphenhydramine-acetaminophen (TYLENOL PM) 25-500 MG TABS tablet Take 1 tablet by mouth at bedtime as needed (headache).    [provider]  ergocalciferol (VITAMIN D2) 1.25 MG (50000 UT) capsule Take 50,000 Units by mouth once a week.    [provider]  famotidine (PEPCID) 40 MG tablet Take 40 mg by mouth daily as needed for heartburn. 07/22/20   [provider]  furosemide (LASIX) 40 MG tablet Take 40 mg by mouth daily as needed for edema. 03/24/20   [provider]  MAGNESIUM CITRATE PO Take 1 tablet by mouth daily.    [provider]  Misc Natural Products (BRAINSTRONG MEMORY SUPPORT) TABS Take 1 tablet by mouth daily.    [provider]  Omega-3 Fatty Acids (FISH OIL PO) Take 1 tablet by mouth daily.    [provider]  omeprazole (PRILOSEC) 40 MG capsule Take 40 mg by mouth daily as needed (heartburn). 03/02/16   [provider]  OVER THE COUNTER MEDICATION Take 1 tablet by mouth daily.    [provider]  oxybutynin (DITROPAN-XL) 5 MG 24 hr tablet Take 5 mg by mouth daily as needed (bladder).    [provider]  predniSONE (DELTASONE) 20 MG tablet Take 40 mg by mouth daily for 3 days, then 20mg  by mouth daily for 3 days, then 10mg  daily for 3 days Patient taking differently: Take 20 mg by mouth See admin instructions. Take 40 mg by mouth daily for 3 days, then 20mg  by mouth daily for 3 days, then 10mg  daily for 3 days 03/31/20   , PA-C  traMADol (ULTRAM) 50 MG tablet Take 1 tablet (50 mg total) by mouth every 6 (six) hours as needed. 10/24/20   , MD  zinc sulfate 220 (50 Zn)  MG capsule Take 220 mg by mouth daily. 12/28/19   [provider]      Allergies    Propofol, Adhesive [tape], Penicillins, and Codeine    Review of Systems   Review of Systems  Eyes:  Positive for pain.    Physical Exam Updated Vital Signs BP (!) 171/109   Pulse 78   Temp 98.2 F (36.8 C) (Oral)   Resp 16   Ht 4\' 10"  (1.473 m)   Wt 96.6 kg   SpO2 97%   BMI 44.52 kg/m  Physical Exam Vitals and nursing note reviewed.  Constitutional:      Appearance: Normal appearance.  HENT:     Head: Normocephalic and atraumatic.     Mouth/Throat:     Mouth: Mucous membranes are moist.     Pharynx: Oropharynx is clear.  Eyes:     General: No scleral  icterus.    Extraocular Movements: Extraocular movements intact.     Conjunctiva/sclera: Conjunctivae normal.     Pupils: Pupils are equal, round, and reactive to light.     Comments: Bilateral eye tearing.  Cobblestoning in eyelids.  Pulmonary:     Effort: Pulmonary effort is normal. No respiratory distress.  Skin:    Findings: No rash.  Neurological:     Mental Status: She is alert.  Psychiatric:        Mood and Affect: Mood normal.     ED Results / Procedures / Treatments   Labs (all labs ordered are listed, but only abnormal results are displayed) Labs Reviewed - No data to display  EKG None  Radiology No results found.  Procedures Procedures   Medications Ordered in ED Medications - No data to display  ED Course/ Medical Decision Making/ A&P                           Medical Decision Making  71 year old female presenting today with bilateral eye burning.  Physical exam consistent with viral conjunctivitis.  Hemodynamically stable outside of her hypertension and reports that she does not like to take any medication.  Does report that she takes Zyrtec maybe every other day.  She was encouraged to take her antihistamine daily at this time.  She has a normal eye exam without signs or concerns for abrasion/ulceration.  Doubt acute angle glaucoma or retinal artery occlusion.  Stable for discharge at this time.  She is agreeable to the plan and will call her PCP on Monday morning with any further concerns.  Given information about viral conjunctivitis.   Final Clinical Impression(s) / ED Diagnoses Final diagnoses:  Viral conjunctivitis    Rx / DC Orders ED Discharge Orders     None     Results and diagnoses were explained to the patient. Return precautions discussed in full. Patient had no additional questions and expressed complete understanding.   This chart was dictated using voice recognition software.  Despite best efforts to proofread,  errors can occur  which can change the documentation meaning.     Rhae Hammock, PA-C 01/31/22 [id number]    Drenda Freeze, MD 02/01/22 [phone]

## 2022-01-31 NOTE — Discharge Instructions (Addendum)
Read the information about viral conjunctivitis on these discharge papers.  The best treatment are the eyedrops you have been using as well as antihistamines like cetirizine.  Please take this every day for the next month.  Call your primary care on Monday and tell them about your emergency room visit and be reevaluated if your symptoms have not resolved.  It was a pleasure to meet you today and we hope you feel better!

## 2022-02-12 DIAGNOSIS — E7800 Pure hypercholesterolemia, unspecified: Secondary | ICD-10-CM | POA: Diagnosis not present

## 2022-02-12 DIAGNOSIS — Z780 Asymptomatic menopausal state: Secondary | ICD-10-CM | POA: Diagnosis not present

## 2022-02-12 DIAGNOSIS — R9431 Abnormal electrocardiogram [ECG] [EKG]: Secondary | ICD-10-CM | POA: Diagnosis not present

## 2022-02-12 DIAGNOSIS — Z09 Encounter for follow-up examination after completed treatment for conditions other than malignant neoplasm: Secondary | ICD-10-CM | POA: Diagnosis not present

## 2022-02-12 DIAGNOSIS — R208 Other disturbances of skin sensation: Secondary | ICD-10-CM | POA: Diagnosis not present

## 2022-02-12 DIAGNOSIS — Z6841 Body Mass Index (BMI) 40.0 and over, adult: Secondary | ICD-10-CM | POA: Diagnosis not present

## 2022-02-12 DIAGNOSIS — Z5320 Procedure and treatment not carried out because of patient's decision for unspecified reasons: Secondary | ICD-10-CM | POA: Diagnosis not present

## 2022-02-12 DIAGNOSIS — E559 Vitamin D deficiency, unspecified: Secondary | ICD-10-CM | POA: Diagnosis not present

## 2022-02-12 DIAGNOSIS — R7303 Prediabetes: Secondary | ICD-10-CM | POA: Diagnosis not present

## 2022-02-12 DIAGNOSIS — R030 Elevated blood-pressure reading, without diagnosis of hypertension: Secondary | ICD-10-CM | POA: Diagnosis not present

## 2022-02-12 DIAGNOSIS — Z86010 Personal history of colonic polyps: Secondary | ICD-10-CM | POA: Diagnosis not present

## 2022-02-18 DIAGNOSIS — Z780 Asymptomatic menopausal state: Secondary | ICD-10-CM | POA: Diagnosis not present

## 2022-07-09 ENCOUNTER — Encounter: Payer: Self-pay | Admitting: Internal Medicine

## 2024-05-05 ENCOUNTER — Other Ambulatory Visit: Payer: Self-pay

## 2024-05-05 DIAGNOSIS — R109 Unspecified abdominal pain: Secondary | ICD-10-CM

## 2024-05-17 ENCOUNTER — Ambulatory Visit
Admission: RE | Admit: 2024-05-17 | Discharge: 2024-05-17 | Disposition: A | Discharge status: Home / Self Care | Source: Ambulatory Visit

## 2024-05-17 DIAGNOSIS — R109 Unspecified abdominal pain: Secondary | ICD-10-CM

## 2024-06-01 ENCOUNTER — Other Ambulatory Visit: Payer: Self-pay

## 2024-06-01 DIAGNOSIS — M79676 Pain in unspecified toe(s): Secondary | ICD-10-CM

## 2024-06-22 ENCOUNTER — Encounter: Admitting: Vascular Surgery

## 2024-06-22 ENCOUNTER — Encounter: Payer: Self-pay | Admitting: Vascular Surgery

## 2024-06-22 ENCOUNTER — Ambulatory Visit: Admitting: Vascular Surgery

## 2024-06-22 ENCOUNTER — Ambulatory Visit (HOSPITAL_COMMUNITY): Admission: RE | Admit: 2024-06-22 | Discharge: 2024-06-22 | Attending: Vascular Surgery

## 2024-06-22 VITALS — BP 174/86 | HR 91 | Temp 98.1°F | Resp 20 | Ht <= 58 in | Wt 215.0 lb

## 2024-06-22 DIAGNOSIS — M79676 Pain in unspecified toe(s): Secondary | ICD-10-CM | POA: Diagnosis not present

## 2024-06-22 DIAGNOSIS — M792 Neuralgia and neuritis, unspecified: Secondary | ICD-10-CM

## 2024-06-22 LAB — VAS US ABI WITH/WO TBI
Left ABI: 1.15
Right ABI: 1.15

## 2024-06-22 NOTE — Progress Notes (Signed)
 " Office Note    CC: Concern for peripheral arterial disease Requesting Provider:  Claude Reagin, NP  HPI: Lindsay Fischer is a 74 y.o. (April 28, 1951) female presenting at the request of .Chinnasamy, Krishnamani, MD (Inactive) due to concern for peripheral arterial disease.  On exam, Lindsay Fischer was doing well.  Native of [place], she immigrated at the age of 41.  She lived several years in [place]  prior to making her way to [place].  Medical history includes bilateral knee replacements, shoulder replacements.  At the time of her right sided knee replacement in 2010, she had an arterial pseudoaneurysm requiring interposition bypass graft at [facility].  This was performed by Dr. Celestia, and was successful.  This was done from a posterior approach and the bypass was very short per op notes.  He discharged her from follow-up several years ago.  Lindsay Fischer's main complaint today was focal pain in the right quad that then extended down into the lower leg.  She notes that the zinging-type pain is accompanied with lower extremity weakness.  This has been present for decades, but she states it has gotten worse over the last several weeks.  She also notes that she has numbness and pain on the medial aspect of the right leg at the previous saphenectomy site, and some pain at the scar from the posterior popliteal artery approach.  Denies symptoms of ischemic rest pain, tissue loss, claudication.  ASA, statin  Past Medical History:  Diagnosis Date   Arthritis    shoulders   Gastric ulcer    GERD (gastroesophageal reflux disease)    History of asthma    as a child   History of kidney stones    Obesity    Osteoarthritis    bilateral knee   Right trigger finger 05/2016   ring and small fingers   Wears dentures    upper    Past Surgical History:  Procedure Laterality Date   CARPAL TUNNEL RELEASE Right    CHOLECYSTECTOMY     COLONOSCOPY WITH PROPOFOL   06/09/2012   ESOPHAGOGASTRODUODENOSCOPY  (EGD) WITH ESOPHAGEAL DILATION  05/27/2012   with Propofol    KIDNEY STONE SURGERY     LITHOTRIPSY  03/27/2016   LITHOTRIPSY     ORIF FINGER FRACTURE Left    small finger   TOTAL KNEE ARTHROPLASTY Bilateral    TRIGGER FINGER RELEASE Right    middle finger   TRIGGER FINGER RELEASE Right 06/16/2016   Procedure: RELEASE TRIGGER FINGER/A-1 PULLEY RIGHT RING SMALL FINGER;  Surgeon: Arley Curia, MD;  Location: [facility];  Service: Orthopedics;  Laterality: Right;  IV REG/FAB   TUBAL LIGATION      Social History   Socioeconomic History   Marital status: Single    Spouse name: Not on file   Number of children: 4   Years of education: Not on file   Highest education level: Not on file  Occupational History   Not on file  Tobacco Use   Smoking status: Never   Smokeless tobacco: Never  Substance and Sexual Activity   Alcohol  use: Yes    Comment: rarely   Drug use: No   Sexual activity: Not on file  Other Topics Concern   Not on file  Social History Narrative   Daily caffeine    Social Drivers of Health   Tobacco Use: Low Risk (02/17/2024)   Received from [facility]   Patient History    Smoking Tobacco Use: Never  Smokeless Tobacco Use: Never    Passive Exposure: Not on file  Financial Resource Strain: Low Risk (10/26/2022)   Received from [facility]   Overall Financial Resource Strain (CARDIA)    Difficulty of Paying Living Expenses: Not hard at all  Food Insecurity: Low Risk (11/23/2023)   Received from [facility]   Epic    Within the past 12 months, you worried that your food would run out before you got money to buy more: Never true    Within the past 12 months, the food you bought just didn't last and you didn't have money to get more. : Never true  Transportation Needs: No Transportation Needs (11/23/2023)   Received from [facility]    In the past 12 months, has lack of reliable transportation kept you from medical  appointments, meetings, work or from getting things needed for daily living? : No  Physical Activity: Not on file  Stress: Not on file  Social Connections: Not on file  Intimate Partner Violence: Not on file  Depression (EYV7-0): Not on file  Alcohol  Screen: Not on file  Housing: Low Risk (11/23/2023)   Received from [facility]   Epic    What is your living situation today?: I have a steady place to live    Think about the place you live. Do you have problems with any of the following? Choose all that apply:: None/None on this list  Utilities: Low Risk (11/23/2023)   Received from [facility]   Utilities    In the past 12 months has the electric, gas, oil, or water company threatened to shut off services in your home? : No  Health Literacy: Not on file   Family History  Adopted: Yes    Current Outpatient Medications  Medication Sig Dispense Refill   albuterol  (VENTOLIN  HFA) 108 (90 Base) MCG/ACT inhaler Inhale 2 puffs into the lungs every 4 (four) hours as needed for wheezing or shortness of breath. 18 g 0   aspirin 81 MG EC tablet Take 81 mg by mouth daily.     atorvastatin (LIPITOR) 10 MG tablet Take 10 mg by mouth daily.     benzonatate  (TESSALON ) 100 MG capsule Take 1 capsule (100 mg total) by mouth every 8 (eight) hours. (Patient not taking: No sig reported) 21 capsule 0   cetirizine (ZYRTEC) 10 MG tablet Take 1 tablet by mouth daily.     Cholecalciferol 25 MCG (1000 UT) tablet Take 1,000 Units by mouth daily.     Cyanocobalamin  (B-12 PO) Take 1 tablet by mouth daily.     diphenhydramine-acetaminophen  (TYLENOL  PM) 25-500 MG TABS tablet Take 1 tablet by mouth at bedtime as needed (headache).     ergocalciferol (VITAMIN D2) 1.25 MG (50000 UT) capsule Take 50,000 Units by mouth once a week.     famotidine (PEPCID) 40 MG tablet Take 40 mg by mouth daily as needed for heartburn.     furosemide (LASIX) 40 MG tablet Take 40 mg by mouth daily as needed for edema.     MAGNESIUM  CITRATE PO Take 1 tablet by mouth daily.     Misc Natural Products (BRAINSTRONG MEMORY SUPPORT) TABS Take 1 tablet by mouth daily.     Omega-3 Fatty Acids (FISH OIL PO) Take 1 tablet by mouth daily.     omeprazole  (PRILOSEC) 40 MG capsule Take 40 mg by mouth daily as needed (heartburn).     OVER THE COUNTER MEDICATION Take 1 tablet by  mouth daily.     oxybutynin (DITROPAN-XL) 5 MG 24 hr tablet Take 5 mg by mouth daily as needed (bladder).     predniSONE  (DELTASONE ) 20 MG tablet Take 40 mg by mouth daily for 3 days, then 20mg  by mouth daily for 3 days, then 10mg  daily for 3 days (Patient taking differently: Take 20 mg by mouth See admin instructions. Take 40 mg by mouth daily for 3 days, then 20mg  by mouth daily for 3 days, then 10mg  daily for 3 days) 12 tablet 0   traMADol  (ULTRAM ) 50 MG tablet Take 1 tablet (50 mg total) by mouth every 6 (six) hours as needed. 6 tablet 0   zinc sulfate 220 (50 Zn) MG capsule Take 220 mg by mouth daily.     No current facility-administered medications for this visit.    Allergies[1]   REVIEW OF SYSTEMS:  [X]  denotes positive finding, [ ]  denotes negative finding Cardiac  Comments:  Chest pain or chest pressure:    Shortness of breath upon exertion:    Short of breath when lying flat:    Irregular heart rhythm:        Vascular    Pain in calf, thigh, or hip brought on by ambulation:    Pain in feet at night that wakes you up from your sleep:     Blood clot in your veins:    Leg swelling:         Pulmonary    Oxygen at home:    Productive cough:     Wheezing:         Neurologic    Sudden weakness in arms or legs:     Sudden numbness in arms or legs:     Sudden onset of difficulty speaking or slurred speech:    Temporary loss of vision in one eye:     Problems with dizziness:         Gastrointestinal    Blood in stool:     Vomited blood:         Genitourinary    Burning when urinating:     Blood in urine:        Psychiatric    Major  depression:         Hematologic    Bleeding problems:    Problems with blood clotting too easily:        Skin    Rashes or ulcers:        Constitutional    Fever or chills:      PHYSICAL EXAMINATION:  There were no vitals filed for this visit.  General:  WDWN in NAD; vital signs documented above Gait: Not observed HENT: WNL, normocephalic Pulmonary: normal non-labored breathing , without wheezing Cardiac: regular HR Abdomen: soft, NT, no masses Skin: without rashes Vascular Exam/Pulses:  Right Left  Radial 2+ (normal) 2+ (normal)  Ulnar    Femoral    Popliteal    DP 2+ (normal) 2+ (normal)  PT     Extremities: without ischemic changes, without Gangrene , without cellulitis; without open wounds;  Musculoskeletal: no muscle wasting or atrophy  Neurologic: A&O X 3;  No focal weakness or paresthesias are detected Psychiatric:  The pt has Normal affect.   Non-Invasive Vascular Imaging:    ABI Findings:  +---------+------------------+-----+--------+--------+  Right   Rt Pressure (mmHg)IndexWaveformComment   +---------+------------------+-----+--------+--------+  Brachial 161                                      +---------+------------------+-----+--------+--------+  PTA     203               1.15 biphasic          +---------+------------------+-----+--------+--------+  DP      168               0.95 biphasic          +---------+------------------+-----+--------+--------+  Great Toe162               0.92 Normal            +---------+------------------+-----+--------+--------+   +---------+------------------+-----+---------+-------+  Left    Lt Pressure (mmHg)IndexWaveform Comment  +---------+------------------+-----+---------+-------+  Brachial 177                                      +---------+------------------+-----+---------+-------+  PTA     203               1.15 triphasic          +---------+------------------+-----+---------+-------+  DP      193               1.09 biphasic          +---------+------------------+-----+---------+-------+  Great Toe129               0.73 Normal            +---------+------------------+-----+---------+-------+   +-------+-----------+-----------+------------+------------+  ABI/TBIToday's ABIToday's TBIPrevious ABIPrevious TBI  +-------+-----------+-----------+------------+------------+  Right 1.15       0.92                                 +-------+-----------+-----------+------------+------------+  Left  1.15       0.73                                 +-------+-----------+-----------+------------+------------+      ASSESSMENT/PLAN: Lindsay Fischer is a 74 y.o. 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  ABIs normal.  She has a palpable pulse in the foot.  I have no concerns about the right sided bypass.  Even though she was discharged from [facility], I think that bypass should be surveilled yearly.  I think the pain and numbness that she has on the medial aspect of the thigh at the saphenectomy site is neuropathic, and is permanent.  Furthermore I do not have an intervention to remove the scar tissue present at the popliteal fossa.  I asked her to discuss the neuropathic pain with her orthopedic surgeon next week to see if there is an intervention or therapy that could be offered.    Fonda FORBES Rim, MD [facility] [phone]     [1]  Allergies Allergen Reactions   Propofol  Other (See Comments)    Patient reports intolerable burning when administered   Adhesive [Tape] Other (See Comments)    BLISTERS   Penicillins Hives   Codeine Nausea And Vomiting    chills   "

## 2024-06-29 ENCOUNTER — Encounter: Admitting: Vascular Surgery
